# Patient Record
Sex: Female | Born: 1980 | Race: Asian | Hispanic: No | Marital: Married | Smoking: Never smoker
Health system: Southern US, Community
[De-identification: ages and names within clinical notes are randomized; demographics above are authoritative.]

## PROBLEM LIST (undated history)

## (undated) DIAGNOSIS — Z789 Other specified health status: Secondary | ICD-10-CM

## (undated) DIAGNOSIS — K219 Gastro-esophageal reflux disease without esophagitis: Secondary | ICD-10-CM

## (undated) HISTORY — DX: Gastro-esophageal reflux disease without esophagitis: K21.9

## (undated) HISTORY — PX: APPENDECTOMY: SHX54

## (undated) HISTORY — PX: UPPER GASTROINTESTINAL ENDOSCOPY: SHX188

---

## 2019-01-27 ENCOUNTER — Inpatient Hospital Stay (HOSPITAL_COMMUNITY)
Admission: AD | Admit: 2019-01-27 | Discharge: 2019-01-27 | Disposition: A | Discharge status: Home / Self Care | Payer: 59 | Attending: Obstetrics & Gynecology | Admitting: Obstetrics & Gynecology

## 2019-01-27 ENCOUNTER — Other Ambulatory Visit: Payer: Self-pay

## 2019-01-27 ENCOUNTER — Encounter (HOSPITAL_COMMUNITY): Payer: Self-pay | Admitting: *Deleted

## 2019-01-27 ENCOUNTER — Other Ambulatory Visit: Payer: Self-pay | Admitting: Obstetrics & Gynecology

## 2019-01-27 DIAGNOSIS — O00102 Left tubal pregnancy without intrauterine pregnancy: Secondary | ICD-10-CM | POA: Insufficient documentation

## 2019-01-27 HISTORY — DX: Other specified health status: Z78.9

## 2019-01-27 LAB — COMPREHENSIVE METABOLIC PANEL
ALT: 16 U/L (ref 0–44)
AST: 20 U/L (ref 15–41)
Albumin: 3.8 g/dL (ref 3.5–5.0)
Alkaline Phosphatase: 46 U/L (ref 38–126)
Anion gap: 8 (ref 5–15)
BUN: 8 mg/dL (ref 6–20)
CO2: 24 mmol/L (ref 22–32)
Calcium: 9.4 mg/dL (ref 8.9–10.3)
Chloride: 105 mmol/L (ref 98–111)
Creatinine, Ser: 0.63 mg/dL (ref 0.44–1.00)
GFR calc Af Amer: >60 mL/min (ref >60)
GFR calc non Af Amer: >60 mL/min (ref >60)
Glucose, Bld: 101 mg/dL — ABNORMAL HIGH (ref 70–99)
Potassium: 3.9 mmol/L (ref 3.5–5.1)
Sodium: 137 mmol/L (ref 135–145)
Total Bilirubin: 0.6 mg/dL (ref 0.3–1.2)
Total Protein: 7.1 g/dL (ref 6.5–8.1)

## 2019-01-27 LAB — CBC WITH DIFFERENTIAL/PLATELET
Abs Immature Granulocytes: 0.04 10*3/uL (ref 0.00–0.07)
Basophils Absolute: 0 10*3/uL (ref 0.0–0.1)
Basophils Relative: 0 %
Eosinophils Absolute: 0 10*3/uL (ref 0.0–0.5)
Eosinophils Relative: 0 %
HCT: 36.7 % (ref 36.0–46.0)
Hemoglobin: 12.2 g/dL (ref 12.0–15.0)
Immature Granulocytes: 0 %
Lymphocytes Relative: 15 %
Lymphs Abs: 1.6 10*3/uL (ref 0.7–4.0)
MCH: 28.8 pg (ref 26.0–34.0)
MCHC: 33.2 g/dL (ref 30.0–36.0)
MCV: 86.8 fL (ref 80.0–100.0)
Monocytes Absolute: 0.5 10*3/uL (ref 0.1–1.0)
Monocytes Relative: 4 %
Neutro Abs: 8.9 10*3/uL — ABNORMAL HIGH (ref 1.7–7.7)
Neutrophils Relative %: 81 %
Platelets: 285 10*3/uL (ref 150–400)
RBC: 4.23 MIL/uL (ref 3.87–5.11)
RDW: 13.2 % (ref 11.5–15.5)
WBC: 11.1 10*3/uL — ABNORMAL HIGH (ref 4.0–10.5)
nRBC: 0 % (ref 0.0–0.2)

## 2019-01-27 LAB — HCG, QUANTITATIVE, PREGNANCY: hCG, Beta Chain, Quant, S: 4471 m[IU]/mL — ABNORMAL HIGH (ref <5)

## 2019-01-27 MED ORDER — METHOTREXATE FOR ECTOPIC PREGNANCY
50.0000 mg/m2 | Freq: Once | INTRAMUSCULAR | Status: AC
  Administered 2019-01-27: 80 mg via INTRAMUSCULAR
  Filled 2019-01-27: qty 1

## 2019-01-27 NOTE — MAU Note (Signed)
Pt taking prenatal vitamins and progesterone, instructed pt to stop both.

## 2019-01-27 NOTE — MAU Note (Signed)
Pregnancy has been followed in the office, inappropriate rise in hormone level, left ectopic dx on [place] today.  Pt reports some spotting 6 days ago, denies pain.

## 2019-01-27 NOTE — MAU Note (Signed)
Diagnosed with left ectopic on [place] today, sent from office for Methotrexate.

## 2019-01-27 NOTE — Discharge Instructions (Signed)
Methotrexate Treatment for an Ectopic Pregnancy, Care After °This sheet gives you information about how to care for yourself after your procedure. Your health care provider may also give you more specific instructions. If you have problems or questions, contact your health care provider. °What can I expect after the procedure? °After the procedure, it is common to have: °· Abdominal cramping. °· Vaginal bleeding. °· Fatigue. °· Nausea. °· Vomiting. °· Diarrhea. °Blood tests will be taken at timed intervals for several days or weeks to check your pregnancy hormone levels. The blood tests will be done until the pregnancy hormone can no longer be detected in the blood. °Follow these instructions at home: °Activity °· Do not have sex until your health care provider approves. °· Limit activities that take a lot of effort as told by your health care provider. °Medicines °· Take over the counter and prescription medicines only as told by your health care provider. °· Do not take aspirin, ibuprofen, naproxen, or any other NSAIDs. °· Do not take folic acid, prenatal vitamins, or other vitamins that contain folic acid. °General instructions ° °· Do not drink alcohol. °· Follow instructions from your health care provider on how and when to report any symptoms that may indicate a ruptured ectopic pregnancy. °· Keep all follow-up visits as told by your health care provider. This is important. °Contact a health care provider if: °· You have persistent nausea and vomiting. °· You have persistent diarrhea. °· You are having a reaction to the medicine, such as: °? Tiredness. °? Skin rash. °? Hair loss. °Get help right away if: °· Your abdominal or pelvic pain gets worse. °· You have more vaginal bleeding. °· You feel light-headed or you faint. °· You have shortness of breath. °· Your heart rate increases. °· You develop a cough. °· You have chills. °· You have a fever. °Summary °· After the procedure, it is common to have symptoms  of abdominal cramping, vaginal bleeding and fatigue. You may also experience other symptoms. °· Blood tests will be taken at timed intervals for several days or weeks to check your pregnancy hormone levels. The blood tests will be done until the pregnancy hormone can no longer be detected in the blood. °· Limit strenuous activity as told by your health care provider. °· Follow instructions from your health care provider on how and when to report any symptoms that may indicate a ruptured ectopic pregnancy. °This information is not intended to replace advice given to you by your health care provider. Make sure you discuss any questions you have with your health care provider. °Document Released: 07/23/2011 Document Revised: 09/22/2016 Document Reviewed: 09/22/2016 °Elsevier Interactive Patient Education © 2019 [employer] ° °

## 2019-01-28 LAB — ABO/RH: ABO/RH(D): O POS

## 2019-02-02 ENCOUNTER — Inpatient Hospital Stay (HOSPITAL_COMMUNITY)
Admission: AD | Admit: 2019-02-02 | Discharge: 2019-02-02 | Disposition: A | Discharge status: Home / Self Care | Payer: 59 | Attending: Obstetrics | Admitting: Obstetrics

## 2019-02-02 ENCOUNTER — Other Ambulatory Visit: Payer: Self-pay

## 2019-02-02 ENCOUNTER — Other Ambulatory Visit: Payer: Self-pay | Admitting: Obstetrics & Gynecology

## 2019-02-02 DIAGNOSIS — Z3A00 Weeks of gestation of pregnancy not specified: Secondary | ICD-10-CM | POA: Diagnosis not present

## 2019-02-02 DIAGNOSIS — O00102 Left tubal pregnancy without intrauterine pregnancy: Secondary | ICD-10-CM | POA: Insufficient documentation

## 2019-02-02 DIAGNOSIS — O0000 Abdominal pregnancy without intrauterine pregnancy: Secondary | ICD-10-CM

## 2019-02-02 DIAGNOSIS — O009 Unspecified ectopic pregnancy without intrauterine pregnancy: Secondary | ICD-10-CM | POA: Insufficient documentation

## 2019-02-02 LAB — CBC WITH DIFFERENTIAL/PLATELET
Abs Immature Granulocytes: 0.01 10*3/uL (ref 0.00–0.07)
Basophils Absolute: 0 10*3/uL (ref 0.0–0.1)
Basophils Relative: 0 %
Eosinophils Absolute: 0 10*3/uL (ref 0.0–0.5)
Eosinophils Relative: 0 %
HCT: 35 % — ABNORMAL LOW (ref 36.0–46.0)
Hemoglobin: 11.7 g/dL — ABNORMAL LOW (ref 12.0–15.0)
Immature Granulocytes: 0 %
Lymphocytes Relative: 25 %
Lymphs Abs: 1.7 10*3/uL (ref 0.7–4.0)
MCH: 29 pg (ref 26.0–34.0)
MCHC: 33.4 g/dL (ref 30.0–36.0)
MCV: 86.6 fL (ref 80.0–100.0)
Monocytes Absolute: 0.5 10*3/uL (ref 0.1–1.0)
Monocytes Relative: 7 %
Neutro Abs: 4.6 10*3/uL (ref 1.7–7.7)
Neutrophils Relative %: 68 %
Platelets: 289 10*3/uL (ref 150–400)
RBC: 4.04 MIL/uL (ref 3.87–5.11)
RDW: 13 % (ref 11.5–15.5)
WBC: 6.9 10*3/uL (ref 4.0–10.5)
nRBC: 0 % (ref 0.0–0.2)

## 2019-02-02 LAB — COMPREHENSIVE METABOLIC PANEL
ALT: 14 U/L (ref 0–44)
AST: 15 U/L (ref 15–41)
Albumin: 3.9 g/dL (ref 3.5–5.0)
Alkaline Phosphatase: 41 U/L (ref 38–126)
Anion gap: 7 (ref 5–15)
BUN: <5 mg/dL — ABNORMAL LOW (ref 6–20)
CO2: 24 mmol/L (ref 22–32)
Calcium: 9.5 mg/dL (ref 8.9–10.3)
Chloride: 106 mmol/L (ref 98–111)
Creatinine, Ser: 0.63 mg/dL (ref 0.44–1.00)
GFR calc Af Amer: >60 mL/min (ref >60)
GFR calc non Af Amer: >60 mL/min (ref >60)
Glucose, Bld: 93 mg/dL (ref 70–99)
Potassium: 4 mmol/L (ref 3.5–5.1)
Sodium: 137 mmol/L (ref 135–145)
Total Bilirubin: 0.5 mg/dL (ref 0.3–1.2)
Total Protein: 6.9 g/dL (ref 6.5–8.1)

## 2019-02-02 MED ORDER — METHOTREXATE FOR ECTOPIC PREGNANCY
50.0000 mg/m2 | Freq: Once | INTRAMUSCULAR | Status: AC
  Administered 2019-02-02: 80 mg via INTRAMUSCULAR
  Filled 2019-02-02: qty 1

## 2019-02-02 NOTE — MAU Note (Signed)
Pt seen in office for D7 quant after Methotrexate.  D4 3300  D7 3400  Sono- adnexal mass persists, 2.9 cm (slightly larger), no FP or FCA, no free fluid. Pt denies pain.   Reviewed options, surgery vs repeat Methotrexate and close observation Pt not sure of what she wants, spoke with husband in office as well. Surgery and recovery reviewed, prior L/scopy for app'y at 19. Also reviewed stable enough to repeat MTX. Agreed to proceed with methotrexate and check quant in office on 6/22 AM and plan surgery if not appropriate or symptomatic Warning s/s reviewed.   V.Sidney Kann, MD

## 2019-02-02 NOTE — MAU Note (Signed)
Pt here for second dose of MTX. Denies any pain. Scant spotting. Had vag u/s and BHCG in office today. Dr Benjie Karvonen spoke with pt tonight about poc and pt decided for second dose of MTX. Will follow up Monday am in office. Pt's labs drawn in Triage. Understands will wait in lobby for lab results, will have MTX, wait 76mins and then d/c home if no concerns arise.

## 2019-02-02 NOTE — MAU Note (Signed)
Dr Benjie Karvonen notified of pt's lab results.  OK to give MTX

## 2019-02-11 ENCOUNTER — Ambulatory Visit (HOSPITAL_COMMUNITY)
Admission: AD | Admit: 2019-02-11 | Discharge: 2019-02-11 | Disposition: A | Discharge status: Home / Self Care | Payer: 59 | Attending: Obstetrics and Gynecology | Admitting: Obstetrics and Gynecology

## 2019-02-11 ENCOUNTER — Inpatient Hospital Stay (HOSPITAL_COMMUNITY): Payer: 59

## 2019-02-11 ENCOUNTER — Other Ambulatory Visit: Payer: Self-pay

## 2019-02-11 ENCOUNTER — Encounter (HOSPITAL_COMMUNITY)
Admission: AD | Disposition: A | Discharge status: Home / Self Care | Payer: Self-pay | Source: Home / Self Care | Attending: Obstetrics and Gynecology

## 2019-02-11 ENCOUNTER — Inpatient Hospital Stay (HOSPITAL_COMMUNITY): Payer: 59 | Admitting: Anesthesiology

## 2019-02-11 ENCOUNTER — Encounter (HOSPITAL_COMMUNITY): Payer: Self-pay

## 2019-02-11 DIAGNOSIS — O009 Unspecified ectopic pregnancy without intrauterine pregnancy: Secondary | ICD-10-CM | POA: Diagnosis present

## 2019-02-11 DIAGNOSIS — Z1159 Encounter for screening for other viral diseases: Secondary | ICD-10-CM | POA: Insufficient documentation

## 2019-02-11 DIAGNOSIS — O4690 Antepartum hemorrhage, unspecified, unspecified trimester: Secondary | ICD-10-CM

## 2019-02-11 DIAGNOSIS — O00102 Left tubal pregnancy without intrauterine pregnancy: Secondary | ICD-10-CM | POA: Insufficient documentation

## 2019-02-11 DIAGNOSIS — O00202 Left ovarian pregnancy without intrauterine pregnancy: Secondary | ICD-10-CM

## 2019-02-11 DIAGNOSIS — Z3A08 8 weeks gestation of pregnancy: Secondary | ICD-10-CM | POA: Insufficient documentation

## 2019-02-11 DIAGNOSIS — R42 Dizziness and giddiness: Secondary | ICD-10-CM

## 2019-02-11 DIAGNOSIS — O26899 Other specified pregnancy related conditions, unspecified trimester: Secondary | ICD-10-CM

## 2019-02-11 HISTORY — PX: DIAGNOSTIC LAPAROSCOPY WITH REMOVAL OF ECTOPIC PREGNANCY: SHX6449

## 2019-02-11 LAB — CBC
HCT: 32.4 % — ABNORMAL LOW (ref 36.0–46.0)
Hemoglobin: 10.7 g/dL — ABNORMAL LOW (ref 12.0–15.0)
MCH: 29.3 pg (ref 26.0–34.0)
MCHC: 33 g/dL (ref 30.0–36.0)
MCV: 88.8 fL (ref 80.0–100.0)
Platelets: 276 10*3/uL (ref 150–400)
RBC: 3.65 MIL/uL — ABNORMAL LOW (ref 3.87–5.11)
RDW: 13.7 % (ref 11.5–15.5)
WBC: 9.5 10*3/uL (ref 4.0–10.5)
nRBC: 0 % (ref 0.0–0.2)

## 2019-02-11 LAB — COMPREHENSIVE METABOLIC PANEL
ALT: 16 U/L (ref 0–44)
AST: 18 U/L (ref 15–41)
Albumin: 3.7 g/dL (ref 3.5–5.0)
Alkaline Phosphatase: 40 U/L (ref 38–126)
Anion gap: 9 (ref 5–15)
BUN: 7 mg/dL (ref 6–20)
CO2: 24 mmol/L (ref 22–32)
Calcium: 9.1 mg/dL (ref 8.9–10.3)
Chloride: 105 mmol/L (ref 98–111)
Creatinine, Ser: 0.65 mg/dL (ref 0.44–1.00)
GFR calc Af Amer: >60 mL/min (ref >60)
GFR calc non Af Amer: >60 mL/min (ref >60)
Glucose, Bld: 101 mg/dL — ABNORMAL HIGH (ref 70–99)
Potassium: 3.8 mmol/L (ref 3.5–5.1)
Sodium: 138 mmol/L (ref 135–145)
Total Bilirubin: 0.4 mg/dL (ref 0.3–1.2)
Total Protein: 6.7 g/dL (ref 6.5–8.1)

## 2019-02-11 LAB — TYPE AND SCREEN
ABO/RH(D): O POS
Antibody Screen: NEGATIVE

## 2019-02-11 LAB — SARS CORONAVIRUS 2 BY RT PCR (HOSPITAL ORDER, PERFORMED IN ~~LOC~~ HOSPITAL LAB): SARS Coronavirus 2: NEGATIVE

## 2019-02-11 LAB — HCG, QUANTITATIVE, PREGNANCY: hCG, Beta Chain, Quant, S: 1808 m[IU]/mL — ABNORMAL HIGH (ref <5)

## 2019-02-11 SURGERY — LAPAROSCOPY, WITH ECTOPIC PREGNANCY SURGICAL TREATMENT
Anesthesia: General

## 2019-02-11 MED ORDER — BUPIVACAINE HCL (PF) 0.25 % IJ SOLN
INTRAMUSCULAR | Status: AC
  Filled 2019-02-11: qty 30

## 2019-02-11 MED ORDER — BUPIVACAINE HCL (PF) 0.25 % IJ SOLN
INTRAMUSCULAR | Status: DC | PRN
  Administered 2019-02-11: 8 mL

## 2019-02-11 MED ORDER — CEFAZOLIN SODIUM 1 G IJ SOLR
INTRAMUSCULAR | Status: AC
  Filled 2019-02-11: qty 20

## 2019-02-11 MED ORDER — IBUPROFEN 800 MG PO TABS: 800.0000 mg | ORAL_TABLET | Freq: Three times a day (TID) | ORAL | 0 refills | Status: DC | PRN

## 2019-02-11 MED ORDER — MIDAZOLAM HCL 5 MG/5ML IJ SOLN
INTRAMUSCULAR | Status: DC | PRN
  Administered 2019-02-11: 2 mg via INTRAVENOUS

## 2019-02-11 MED ORDER — CEFAZOLIN SODIUM-DEXTROSE 2-3 GM-%(50ML) IV SOLR
INTRAVENOUS | Status: DC | PRN
  Administered 2019-02-11: 2 g via INTRAVENOUS

## 2019-02-11 MED ORDER — OXYCODONE HCL 5 MG/5ML PO SOLN: 5.0000 mg | Freq: Once | ORAL | Status: AC | PRN

## 2019-02-11 MED ORDER — SODIUM CHLORIDE 0.9 % IR SOLN
Status: DC | PRN
  Administered 2019-02-11: 1000 mL

## 2019-02-11 MED ORDER — HYDROCODONE-ACETAMINOPHEN 5-325 MG PO TABS: 1.0000 | ORAL_TABLET | Freq: Four times a day (QID) | ORAL | 0 refills | Status: DC | PRN

## 2019-02-11 MED ORDER — FENTANYL CITRATE (PF) 100 MCG/2ML IJ SOLN
INTRAMUSCULAR | Status: DC | PRN
  Administered 2019-02-11 (×2): 50 ug via INTRAVENOUS
  Administered 2019-02-11: 100 ug via INTRAVENOUS
  Administered 2019-02-11: 50 ug via INTRAVENOUS

## 2019-02-11 MED ORDER — KETOROLAC TROMETHAMINE 30 MG/ML IJ SOLN
INTRAMUSCULAR | Status: AC
  Filled 2019-02-11: qty 1

## 2019-02-11 MED ORDER — LIDOCAINE 2% (20 MG/ML) 5 ML SYRINGE
INTRAMUSCULAR | Status: AC
  Filled 2019-02-11: qty 5

## 2019-02-11 MED ORDER — PROPOFOL 10 MG/ML IV BOLUS
INTRAVENOUS | Status: AC
  Filled 2019-02-11: qty 20

## 2019-02-11 MED ORDER — HYDROMORPHONE HCL 1 MG/ML IJ SOLN
INTRAMUSCULAR | Status: AC
  Filled 2019-02-11: qty 1

## 2019-02-11 MED ORDER — ROCURONIUM BROMIDE 50 MG/5ML IV SOSY
PREFILLED_SYRINGE | INTRAVENOUS | Status: DC | PRN
  Administered 2019-02-11: 20 mg via INTRAVENOUS
  Administered 2019-02-11: 40 mg via INTRAVENOUS

## 2019-02-11 MED ORDER — PROMETHAZINE HCL 25 MG/ML IJ SOLN: 6.2500 mg | INTRAMUSCULAR | Status: DC | PRN

## 2019-02-11 MED ORDER — ONDANSETRON HCL 4 MG/2ML IJ SOLN
INTRAMUSCULAR | Status: DC | PRN
  Administered 2019-02-11 (×2): 4 mg via INTRAVENOUS

## 2019-02-11 MED ORDER — HYDROMORPHONE HCL 1 MG/ML IJ SOLN
0.2500 mg | INTRAMUSCULAR | Status: DC | PRN
  Administered 2019-02-11 (×2): 0.25 mg via INTRAVENOUS

## 2019-02-11 MED ORDER — ACETAMINOPHEN 10 MG/ML IV SOLN
INTRAVENOUS | Status: AC
  Filled 2019-02-11: qty 100

## 2019-02-11 MED ORDER — DEXAMETHASONE SODIUM PHOSPHATE 10 MG/ML IJ SOLN
INTRAMUSCULAR | Status: DC | PRN
  Administered 2019-02-11: 10 mg via INTRAVENOUS

## 2019-02-11 MED ORDER — LACTATED RINGERS IV SOLN
INTRAVENOUS | Status: DC
  Administered 2019-02-11 (×2): via INTRAVENOUS

## 2019-02-11 MED ORDER — PHENYLEPHRINE HCL (PRESSORS) 10 MG/ML IV SOLN
INTRAVENOUS | Status: DC | PRN
  Administered 2019-02-11 (×5): 80 ug via INTRAVENOUS

## 2019-02-11 MED ORDER — PHENYLEPHRINE 40 MCG/ML (10ML) SYRINGE FOR IV PUSH (FOR BLOOD PRESSURE SUPPORT)
PREFILLED_SYRINGE | INTRAVENOUS | Status: AC
  Filled 2019-02-11: qty 10

## 2019-02-11 MED ORDER — ACETAMINOPHEN 10 MG/ML IV SOLN
1000.0000 mg | Freq: Once | INTRAVENOUS | Status: DC | PRN
  Administered 2019-02-11: 1000 mg via INTRAVENOUS

## 2019-02-11 MED ORDER — LIDOCAINE 2% (20 MG/ML) 5 ML SYRINGE
INTRAMUSCULAR | Status: DC | PRN
  Administered 2019-02-11: 60 mg via INTRAVENOUS

## 2019-02-11 MED ORDER — ROCURONIUM BROMIDE 10 MG/ML (PF) SYRINGE
PREFILLED_SYRINGE | INTRAVENOUS | Status: AC
  Filled 2019-02-11: qty 10

## 2019-02-11 MED ORDER — OXYCODONE HCL 5 MG PO TABS
5.0000 mg | ORAL_TABLET | Freq: Once | ORAL | Status: AC | PRN
  Administered 2019-02-11: 5 mg via ORAL

## 2019-02-11 MED ORDER — LACTATED RINGERS IV BOLUS
1000.0000 mL | Freq: Once | INTRAVENOUS | Status: AC
  Administered 2019-02-11: 1000 mL via INTRAVENOUS

## 2019-02-11 MED ORDER — PROPOFOL 10 MG/ML IV BOLUS
INTRAVENOUS | Status: DC | PRN
  Administered 2019-02-11: 150 mg via INTRAVENOUS

## 2019-02-11 MED ORDER — SCOPOLAMINE 1 MG/3DAYS TD PT72
MEDICATED_PATCH | TRANSDERMAL | Status: AC
  Administered 2019-02-11: 1.5 mg via TRANSDERMAL
  Filled 2019-02-11: qty 1

## 2019-02-11 MED ORDER — FENTANYL CITRATE (PF) 250 MCG/5ML IJ SOLN
INTRAMUSCULAR | Status: AC
  Filled 2019-02-11: qty 5

## 2019-02-11 MED ORDER — KETOROLAC TROMETHAMINE 30 MG/ML IJ SOLN: 30.0000 mg | Freq: Once | INTRAMUSCULAR | Status: DC

## 2019-02-11 MED ORDER — DEXAMETHASONE SODIUM PHOSPHATE 10 MG/ML IJ SOLN
INTRAMUSCULAR | Status: AC
  Filled 2019-02-11: qty 1

## 2019-02-11 MED ORDER — ONDANSETRON HCL 4 MG/2ML IJ SOLN
INTRAMUSCULAR | Status: AC
  Filled 2019-02-11: qty 2

## 2019-02-11 MED ORDER — OXYCODONE HCL 5 MG PO TABS
ORAL_TABLET | ORAL | Status: AC
  Filled 2019-02-11: qty 1

## 2019-02-11 MED ORDER — MIDAZOLAM HCL 2 MG/2ML IJ SOLN
INTRAMUSCULAR | Status: AC
  Filled 2019-02-11: qty 2

## 2019-02-11 MED ORDER — SCOPOLAMINE 1 MG/3DAYS TD PT72
1.0000 | MEDICATED_PATCH | TRANSDERMAL | Status: DC
  Administered 2019-02-11: 18:00:00 1.5 mg via TRANSDERMAL

## 2019-02-11 SURGICAL SUPPLY — 32 items
CONT SPEC 4OZ CLIKSEAL STRL BL (MISCELLANEOUS) ×3 IMPLANT
COVER WAND RF STERILE (DRAPES) ×3 IMPLANT
DERMABOND ADVANCED (GAUZE/BANDAGES/DRESSINGS) ×2
DERMABOND ADVANCED .7 DNX12 (GAUZE/BANDAGES/DRESSINGS) ×1 IMPLANT
DRSG OPSITE POSTOP 3X4 (GAUZE/BANDAGES/DRESSINGS) ×3 IMPLANT
GLOVE BIOGEL PI IND STRL 6.5 (GLOVE) ×2 IMPLANT
GLOVE BIOGEL PI IND STRL 7.0 (GLOVE) ×2 IMPLANT
GLOVE BIOGEL PI INDICATOR 6.5 (GLOVE) ×4
GLOVE BIOGEL PI INDICATOR 7.0 (GLOVE) ×4
GLOVE ECLIPSE 6.5 STRL STRAW (GLOVE) ×3 IMPLANT
GOWN STRL REUS W/ TWL LRG LVL3 (GOWN DISPOSABLE) ×4 IMPLANT
GOWN STRL REUS W/TWL LRG LVL3 (GOWN DISPOSABLE) ×8
KIT TURNOVER KIT B (KITS) ×3 IMPLANT
PACK LAPAROSCOPY BASIN (CUSTOM PROCEDURE TRAY) ×3 IMPLANT
PACK TRENDGUARD 450 HYBRID PRO (MISCELLANEOUS) ×1 IMPLANT
POUCH SPECIMEN RETRIEVAL 10MM (ENDOMECHANICALS) ×6 IMPLANT
PROTECTOR NERVE ULNAR (MISCELLANEOUS) ×3 IMPLANT
SET IRRIG TUBING LAPAROSCOPIC (IRRIGATION / IRRIGATOR) ×3 IMPLANT
SET TUBE SMOKE EVAC HIGH FLOW (TUBING) ×3 IMPLANT
SHEARS HARMONIC ACE PLUS 36CM (ENDOMECHANICALS) ×3 IMPLANT
SLEEVE ENDOPATH XCEL 5M (ENDOMECHANICALS) ×3 IMPLANT
SUT MNCRL AB 4-0 PS2 18 (SUTURE) ×3 IMPLANT
SUT VIC AB 4-0 PS2 27 (SUTURE) ×3 IMPLANT
SUT VICRYL 0 UR6 27IN ABS (SUTURE) ×3 IMPLANT
SUT VICRYL 4-0 PS2 18IN ABS (SUTURE) ×3 IMPLANT
SYR 5ML LL (SYRINGE) ×3 IMPLANT
TOWEL GREEN STERILE FF (TOWEL DISPOSABLE) ×6 IMPLANT
TRAY FOLEY CATH 14FR (SET/KITS/TRAYS/PACK) ×3 IMPLANT
TRENDGUARD 450 HYBRID PRO PACK (MISCELLANEOUS) ×3
TROCAR XCEL NON-BLD 11X100MML (ENDOMECHANICALS) ×3 IMPLANT
TROCAR XCEL NON-BLD 5MMX100MML (ENDOMECHANICALS) ×3 IMPLANT
WARMER LAPAROSCOPE (MISCELLANEOUS) ×3 IMPLANT

## 2019-02-11 NOTE — H&P (Signed)
Casey Pearson is an 38 y.o. female, G2P1001 with known ectopic pregnancy presents to ED with c/o dizziness today.  Not passing out, just had a mild episode of mild dizziness and was given precautions to go to ED if had sx of dizziness.  Patient reports that she did have some pain last night, took tylenol and did help with pain, but seems to have only given pain relief for about 1 hr.  Pain resolved on its own, denies any pain today.  She denies any vaginal bleeding.  She has only had milk and a cookie at 1100 this am. The patient has been treated with MTX x2 doses, with last dose 02/02/19.  BHCG 3400 6/18, repeated 6/22 (2700) and then bhcg 1700 6/25.  The patient has also been followed for a complex left adnexal mass that was adjacent to left ovary, last measured 6/18 and about 2.9x2.5cm, slightly larger than prior u/s.   Pertinent Gynecological History:   OB History: h/o c/s, term  Menstrual History:  Patient's last menstrual period was 12/14/2018.    Past Medical History:  Diagnosis Date  . Medical history non-contributory     Past Surgical History:  Procedure Laterality Date  . APPENDECTOMY    . CESAREAN SECTION      Family History  Problem Relation Age of Onset  . Diabetes Mother   . Hypertension Father     Social History:  reports that she has never smoked. She has never used smokeless tobacco. She reports that she does not drink alcohol or use drugs.  Allergies: No Known Allergies  No medications prior to admission.    ROS SOB/chest pain/ HA/ vision changes/ LE pain or swelling/ etc.  Blood pressure (!) 114/51, pulse 76, temperature 97.8 F (36.6 C), temperature source Oral, resp. rate 20, height 4\' 11"  (1.499 m), weight 58 kg, last menstrual period 12/14/2018, SpO2 100 %. Physical Exam A&O x 3 HEENT : grossly wnl Lungs : ctab CV : rrr Abdo : soft, nt, no rebound/guarding Extr : no edema, nt Pelvic : deferred   Results for orders placed or performed  during the hospital encounter of 02/11/19 (from the past 24 hour(s))  CBC     Status: Abnormal   Collection Time: 02/11/19  3:08 PM  Result Value Ref Range   WBC 9.5 4.0 - 10.5 K/uL   RBC 3.65 (L) 3.87 - 5.11 MIL/uL   Hemoglobin 10.7 (L) 12.0 - 15.0 g/dL   HCT 16.132.4 (L) 09.636.0 - 04.546.0 %   MCV 88.8 80.0 - 100.0 fL   MCH 29.3 26.0 - 34.0 pg   MCHC 33.0 30.0 - 36.0 g/dL   RDW 40.913.7 81.111.5 - 91.415.5 %   Platelets 276 150 - 400 K/uL   nRBC 0.0 0.0 - 0.2 %  Comprehensive metabolic panel     Status: Abnormal   Collection Time: 02/11/19  3:08 PM  Result Value Ref Range   Sodium 138 135 - 145 mmol/L   Potassium 3.8 3.5 - 5.1 mmol/L   Chloride 105 98 - 111 mmol/L   CO2 24 22 - 32 mmol/L   Glucose, Bld 101 (H) 70 - 99 mg/dL   BUN 7 6 - 20 mg/dL   Creatinine, Ser 7.820.65 0.44 - 1.00 mg/dL   Calcium 9.1 8.9 - 95.610.3 mg/dL   Total Protein 6.7 6.5 - 8.1 g/dL   Albumin 3.7 3.5 - 5.0 g/dL   AST 18 15 - 41 U/L   ALT 16 0 - 44 U/L  Alkaline Phosphatase 40 38 - 126 U/L   Total Bilirubin 0.4 0.3 - 1.2 mg/dL   GFR calc non Af Amer >60 >60 mL/min   GFR calc Af Amer >60 >60 mL/min   Anion gap 9 5 - 15  hCG, quantitative, pregnancy     Status: Abnormal   Collection Time: 02/11/19  3:08 PM  Result Value Ref Range   hCG, Beta Chain, Quant, S 1,808 (H) <5 mIU/mL  SARS Coronavirus 2 (CEPHEID - Performed in [facility] hospital lab), Hosp Order     Status: None   Collection Time: 02/11/19  3:32 PM   Specimen: Nasopharyngeal Swab  Result Value Ref Range   SARS Coronavirus 2 NEGATIVE NEGATIVE    US Ob Less Than 14 Weeks With Ob Transvaginal  Result Date: 02/11/2019 CLINICAL DATA:  38 year old female reportedly 8 weeks 3 days pregnant with a history of known left ectopic pregnancy. She has undergone 2 methotrexate injections and has had pain for the past 3 days. EXAM: OBSTETRIC <14 WK [place] AND TRANSVAGINAL OB US TECHNIQUE: Both transabdominal and transvaginal ultrasound examinations were performed for complete  evaluation of the gestation as well as the maternal uterus, adnexal regions, and pelvic cul-de-sac. Transvaginal technique was performed to assess early pregnancy. COMPARISON:  None. FINDINGS: Intrauterine gestational sac: None Yolk sac:  Not Visualized. Embryo:  Not Visualized. Cardiac Activity: Not Visualized. Maternal uterus/adnexae: The uterus is normal in appearance. The right ovary is also normal in appearance. A heterogeneous soft tissue mass is present in the left adnexal region measuring approximately 6.5 x 3.7 x 5.6 cm. Normal ovary is inseparable from the structure. There is no evidence of internal vascularity. Small volume free fluid layers in the pelvis. IMPRESSION: 1. Approximately 6.5 x 3.7 x 5.6 cm heterogeneous mass in the region of the left adnexa inseparable from the ovary. No evidence of internal vascularity. Findings may represent focal hematoma related to the known ectopic pregnancy versus a potentially torsed left ovary. 2. Small volume of free simple fluid layers in the anatomic pelvis. 3. The uterus and right ovary are unremarkable in appearance. Electronically Signed   By: Jacqulynn Cadet M.D.   On: 02/11/2019 14:53    Assessment/Plan: 38 y/o G2P1001 with ectopic pregnancy 1. I have reviewed the enlarging mass and concern for rupturing of ectopic.  Minimal blood in pelvis but mass has enlarged despite MTX x2 with last dose 6/18.  At this time, I recommemnd  We proceed for l/s salpingectomy and removal of ectopic pregnancy.  Patient is consented for possibel oopohorectomy if necessary and patient agrees.  Failed medical managmetn of ectopic pregnacy.  Reviewed l/s and risk including but not limited to bleedin, infection, injury to bowel, bladder, nerves, blood vessels, risk of further surgery, risk of anesthesia, risk of blood clot to lungs or legs.  Patient sidned consent.  Questions answered.  Proceed to or.  Charyl Bigger 02/11/2019, 5:11 PM

## 2019-02-11 NOTE — MAU Note (Signed)
Pt ok to be transported by transportation staff.

## 2019-02-11 NOTE — Anesthesia Postprocedure Evaluation (Signed)
Anesthesia Post Note  Patient: Therapist, nutritional  Procedure(s) Performed: Laparoscopic LEFT Salpingo Oophorectomy AND REMOVAL OF ECTOPIC PREGNANCY (N/A )     Patient location during evaluation: PACU Anesthesia Type: General Level of consciousness: awake and alert Pain management: pain level controlled Vital Signs Assessment: post-procedure vital signs reviewed and stable Respiratory status: spontaneous breathing, nonlabored ventilation, respiratory function stable and patient connected to nasal cannula oxygen Cardiovascular status: blood pressure returned to baseline and stable Postop Assessment: no apparent nausea or vomiting Anesthetic complications: no    Last Vitals:  Vitals:   02/11/19 2108 02/11/19 2130  BP: (!) 107/59 (!) 103/53  Pulse: 92 92  Resp: 11 15  Temp:  (!) 36.1 C  SpO2: 100% 100%    Last Pain:  Vitals:   02/11/19 2045  TempSrc:   PainSc: 8                  Ryan P Ellender

## 2019-02-11 NOTE — MAU Note (Signed)
Casey Pearson is a 38 y.o. here in MAU reporting: has a left ectopic pregnancy, has had 2 MTX injections. States for the past 3 days her pain has gotten worse. Tylenol was working for the pain but now it is not anymore. States she has been having some bleeding, is changing a pad twice a day.   Onset of complaint: ongoing  Pain score: 5/10  Vitals:   02/11/19 1313  BP: (!) 94/28  Pulse: 68  Resp: 20  Temp: 97.8 F (36.6 C)  SpO2: 100%      Lab orders placed from triage: none

## 2019-02-11 NOTE — Op Note (Signed)
Preoperative diagnosis: ectopic pregnancy, enlarging adnexal mass, failed medical managment Postoperative diagnosis: left ectopic pregnancy with mass including tube and ovary; mild adhesive disease  Procedure: Laparoscopic left salpingo-oophorectomy Surgeon: Dr Tiana Loft, MD Assistants: Artelia Laroche, CNM Anesthesia Gen. Endotracheal IV fluids 1liter LR EBL : 105ml Urine clear: 35ml clear urine at surgery end Complications none Disposition PACU and home Specimens : left tube and ovary  Procedure The patient was seen in MAU and concern for early rupture of ectopic pregnancy.  Known ectopic pregnancy with left adnexal mass, now with enlarging mass.  Recommendation to proceed for laparoscopic salpingectomy, possible oophorectomy. Patient voiced understanding. Informed written consent was obtained and patient was brought to the operating room with IV running. Timeout was carried out. She underwent general anesthesia without difficulty and was given dorsal lithotomy position with left arm tucked. Patient prepped and draped in standard fashion. Foley catheter was placed with clear urine draining. Speculum was placed anterior lip of cervix was grasped with tenaculum, a hulka manipulator was then placed.  Gloves were changed attention was focused on the abdomen. A 10 mm vertical incision was made at the umbilicus after injecting 10 cc Marcaine. The trocar was then introduced into the abdomen under direct visualization.  Opening pressure was 67mmHg and Insufflation was begun with CO2. Patient was given Trendelenburg position. A 0 laparoscope was introduced.  Attention was then drawn to the right lower quadrant and injection of .25%marcaine was injected.  Skin incision was then made to allow placement for 63mm trocar.  Trocar was then advanced w/o difficulty.  A llq trocar was placed in a similar fashion. Adhesions were noted of anterior aspect of uterus to abdominal wall.   Adhesion also noted from omentum  to anterior abdominal wall - this was closely examined after trocars placed and was not disturbed with trocar placement.  There was a small amount of blood in pelvis, no active bleeding.   Attention was drawn to the left adnexa which was grasped and a large mass was noted in the fallopian tube, however the ovary was adherent to the tube and large clots from the tube also encapsulating the ovary.  Right tube and ovary were noted to be normal appearing, and uterus appeared wnl.  Suction was performed of the large mass to visualize the location of the ectopic.  The mass appeared to be in the tube with part of tube near fimbria possible ruptured.  The tube/mass was grasped and the harmonic scalpel was then used to remove the fallopian tube.  This was done in portions with first removing the distal end with the mass to allow better visualization of the ovary and remaining tube.  The ovary was cleared of the clots that were encapsulating it.  The ovary was not easy to separate from the tube and did not appear within normal limits.  Decision was made to remove the ovary as it seemed that the ectopic could have been involving the ovary.  The harmonic scalpel was then used to cauterize and transect the infundibulopelvic ligament and the ovary and remaining tube was removed.  The pedical was hemostatic.  The pelvis was suctioned and cleared of blood.  A specimen bag was introduced and the tube and ovary/mass were placed into the endopouch.  Two bags were used as the first bag did not open correctly and only part of the specimen was able to be placed into the bag.  The trocar was removed and hemostat was used to remove the specimen  until the bag could advance through the fascial opening.  The trocar was then placed back into the abdomen and the second endopouch was then used.  The remaining specimen was placed into this bag and the bag was removed w/o difficulty.  The pelvis was then examined and then irrigated.   The upper  abdomen was examined and appeared wnl.    All counts were correct x2 patient was reversed from general anesthesia extubated and brought out to the recovery room in stable condition. Plan is to discharge home from recovery room. Surgical findings were discussed with patient's family. Followup in office in 2 weeks.

## 2019-02-11 NOTE — Anesthesia Preprocedure Evaluation (Addendum)
Anesthesia Evaluation  Patient identified by MRN, date of birth, ID band Patient awake    Reviewed: Allergy & Precautions, NPO status , Patient's Chart, lab work & pertinent test results  Airway Mallampati: II  TM Distance: >3 FB Neck ROM: Full    Dental no notable dental hx.    Pulmonary neg pulmonary ROS,    Pulmonary exam normal breath sounds clear to auscultation       Cardiovascular negative cardio ROS Normal cardiovascular exam Rhythm:Regular Rate:Normal     Neuro/Psych negative neurological ROS  negative psych ROS   GI/Hepatic negative GI ROS, Neg liver ROS,   Endo/Other  negative endocrine ROS  Renal/GU negative Renal ROS     Musculoskeletal negative musculoskeletal ROS (+)   Abdominal   Peds  Hematology  (+) anemia ,   Anesthesia Other Findings left ectopic pregnancy  Reproductive/Obstetrics                            Anesthesia Physical Anesthesia Plan  ASA: II and emergent  Anesthesia Plan: General   Post-op Pain Management:    Induction: Intravenous and Rapid sequence  PONV Risk Score and Plan: 4 or greater and Ondansetron, Dexamethasone, Midazolam, Scopolamine patch - Pre-op and Treatment may vary due to age or medical condition  Airway Management Planned: Oral ETT  Additional Equipment:   Intra-op Plan:   Post-operative Plan: Extubation in OR  Informed Consent: I have reviewed the patients History and Physical, chart, labs and discussed the procedure including the risks, benefits and alternatives for the proposed anesthesia with the patient or authorized representative who has indicated his/her understanding and acceptance.     Dental advisory given  Plan Discussed with: CRNA  Anesthesia Plan Comments:        Anesthesia Quick Evaluation

## 2019-02-11 NOTE — Transfer of Care (Signed)
Immediate Anesthesia Transfer of Care Note  Patient: Casey Pearson  Procedure(s) Performed: Laparoscopic LEFT Salpingo Oophorectomy AND REMOVAL OF ECTOPIC PREGNANCY (N/A )  Patient Location: PACU  Anesthesia Type:General  Level of Consciousness: awake, alert , oriented and patient cooperative  Airway & Oxygen Therapy: Patient Spontanous Breathing and Patient connected to nasal cannula oxygen  Post-op Assessment: Report given to RN and Post -op Vital signs reviewed and stable  Post vital signs: Reviewed and stable  Last Vitals:  Vitals Value Taken Time  BP 111/48 02/11/19 1956  Temp 36 C 02/11/19 1956  Pulse 95 02/11/19 1957  Resp 17 02/11/19 1957  SpO2 100 % 02/11/19 1957  Vitals shown include unvalidated device data.  Last Pain:  Vitals:   02/11/19 1313  TempSrc: Oral  PainSc:          Complications: No apparent anesthesia complications

## 2019-02-11 NOTE — MAU Provider Note (Signed)
History     CSN: [id number]  Arrival date and time: 02/11/19 1258   First Provider Initiated Contact with Patient 02/11/19 1336      Chief Complaint  Patient presents with  . Back Pain   Ms. Casey Pearson is a 38 y.o. G2P1001 with left ectopic who presents to MAU for pelvic pain x3days and dizziness since this AM. Pt reports she did not report the pain to her OB because she was counseled that she would have pain and bleeding after her MTX injection. Pt reports she came in today because she started feeling dizzy for the first time and was told by a nurse at her office that if she was having dizziness along with her pain that she should present to MAU to be seen.  Pt was treated with MTX x2 for a left ectopic pregnancy, with most recent treatment on 02/02/2019. Pt reports she was seen on Day 4 in office for hCG (2700), and again at the end of this week for Day 7 hCG (1700). Pt reports she was told by her provider that her levels were dropping appropriately, and she is planning to be seen again on Thursday 02/16/2019 for a repeat hCG.  Pt reports her pelvic pain is bilateral, rates as 5/10 and is not visibly uncomfortable and is able to carry on a conversation normally. Pt reports her pain began about 3-4days ago. Pt reports last night she experienced a sharp pain in her right thigh. Pt states her pain usually occurs only at night for about 2-3hrs starting @2000 , but today reports she experienced pelvic pain during the day as well.  Pt reports VB.  Pt denies vaginal discharge/odor/itching. Pt denies N/V, abdominal pain, constipation, diarrhea, or urinary problems. Pt denies fever, chills, fatigue, sweating or changes in appetite. Pt denies SOB or chest pain. Pt denies HA, light-headedness, weakness.   OB History    Gravida  2   Para  1   Term  1   Preterm      AB      Living  1     SAB      TAB      Ectopic      Multiple      Live Births  1        Obstetric  Comments  Failed to dilate, del in [place]        Past Medical History:  Diagnosis Date  . Medical history non-contributory     Past Surgical History:  Procedure Laterality Date  . APPENDECTOMY    . CESAREAN SECTION      Family History  Problem Relation Age of Onset  . Diabetes Mother   . Hypertension Father     Social History   Tobacco Use  . Smoking status: Never Smoker  . Smokeless tobacco: Never Used  Substance Use Topics  . Alcohol use: Never    Frequency: Never  . Drug use: Never    Allergies: No Known Allergies  No medications prior to admission.    Review of Systems  Constitutional: Negative for chills, diaphoresis, fatigue and fever.  Respiratory: Negative for shortness of breath.   Cardiovascular: Negative for chest pain.  Gastrointestinal: Negative for abdominal pain, constipation, diarrhea, nausea and vomiting.  Genitourinary: Positive for pelvic pain and vaginal bleeding. Negative for dysuria, flank pain, frequency, urgency and vaginal discharge.  Neurological: Positive for dizziness. Negative for weakness, light-headedness and headaches.   Physical Exam   Blood pressure (!) 114/51, pulse  76, temperature 97.8 F (36.6 C), temperature source Oral, resp. rate 20, height 4\' 11"  (1.499 m), weight 58 kg, last menstrual period 12/14/2018, SpO2 100 %.  Patient Vitals for the past 24 hrs:  BP Temp Temp src Pulse Resp SpO2 Height Weight  02/11/19 1538 (!) 114/51 - - 76 - - - -  02/11/19 1322 (!) 104/52 - - 69 - - - -  02/11/19 1321 (!) 104/52 - - - 20 - - -  02/11/19 1313 (!) 94/28 97.8 F (36.6 C) Oral 68 20 100 % - -  02/11/19 1306 - - - - - - 4\' 11"  (1.499 m) 58 kg   Physical Exam  Constitutional: She is oriented to person, place, and time. She appears well-developed and well-nourished. No distress.  HENT:  Head: Normocephalic and atraumatic.  Respiratory: Effort normal.  GI: Soft. She exhibits no distension and no mass. There is no abdominal  tenderness. There is no rebound and no guarding.  Neurological: She is alert and oriented to person, place, and time.  Skin: Skin is warm and dry. She is not diaphoretic.  Psychiatric: She has a normal mood and affect. Her behavior is normal. Judgment and thought content normal.   Results for orders placed or performed during the hospital encounter of 02/11/19 (from the past 24 hour(s))  CBC     Status: Abnormal   Collection Time: 02/11/19  3:08 PM  Result Value Ref Range   WBC 9.5 4.0 - 10.5 K/uL   RBC 3.65 (L) 3.87 - 5.11 MIL/uL   Hemoglobin 10.7 (L) 12.0 - 15.0 g/dL   HCT 16.132.4 (L) 09.636.0 - 04.546.0 %   MCV 88.8 80.0 - 100.0 fL   MCH 29.3 26.0 - 34.0 pg   MCHC 33.0 30.0 - 36.0 g/dL   RDW 40.913.7 81.111.5 - 91.415.5 %   Platelets 276 150 - 400 K/uL   nRBC 0.0 0.0 - 0.2 %  Comprehensive metabolic panel     Status: Abnormal   Collection Time: 02/11/19  3:08 PM  Result Value Ref Range   Sodium 138 135 - 145 mmol/L   Potassium 3.8 3.5 - 5.1 mmol/L   Chloride 105 98 - 111 mmol/L   CO2 24 22 - 32 mmol/L   Glucose, Bld 101 (H) 70 - 99 mg/dL   BUN 7 6 - 20 mg/dL   Creatinine, Ser 7.820.65 0.44 - 1.00 mg/dL   Calcium 9.1 8.9 - 95.610.3 mg/dL   Total Protein 6.7 6.5 - 8.1 g/dL   Albumin 3.7 3.5 - 5.0 g/dL   AST 18 15 - 41 U/L   ALT 16 0 - 44 U/L   Alkaline Phosphatase 40 38 - 126 U/L   Total Bilirubin 0.4 0.3 - 1.2 mg/dL   GFR calc non Af Amer >60 >60 mL/min   GFR calc Af Amer >60 >60 mL/min   Anion gap 9 5 - 15  hCG, quantitative, pregnancy     Status: Abnormal   Collection Time: 02/11/19  3:08 PM  Result Value Ref Range   hCG, Beta Chain, Quant, S 1,808 (H) <5 mIU/mL   [place] Ob Less Than 14 Weeks With Ob Transvaginal  Result Date: 02/11/2019 CLINICAL DATA:  38 year old female reportedly 8 weeks 3 days pregnant with a history of known left ectopic pregnancy. She has undergone 2 methotrexate injections and has had pain for the past 3 days. EXAM: OBSTETRIC <14 WK US AND TRANSVAGINAL OB US TECHNIQUE: Both  transabdominal and transvaginal ultrasound examinations were  performed for complete evaluation of the gestation as well as the maternal uterus, adnexal regions, and pelvic cul-de-sac. Transvaginal technique was performed to assess early pregnancy. COMPARISON:  None. FINDINGS: Intrauterine gestational sac: None Yolk sac:  Not Visualized. Embryo:  Not Visualized. Cardiac Activity: Not Visualized. Maternal uterus/adnexae: The uterus is normal in appearance. The right ovary is also normal in appearance. A heterogeneous soft tissue mass is present in the left adnexal region measuring approximately 6.5 x 3.7 x 5.6 cm. Normal ovary is inseparable from the structure. There is no evidence of internal vascularity. Small volume free fluid layers in the pelvis. IMPRESSION: 1. Approximately 6.5 x 3.7 x 5.6 cm heterogeneous mass in the region of the left adnexa inseparable from the ovary. No evidence of internal vascularity. Findings may represent focal hematoma related to the known ectopic pregnancy versus a potentially torsed left ovary. 2. Small volume of free simple fluid layers in the anatomic pelvis. 3. The uterus and right ovary are unremarkable in appearance. Electronically Signed   By: Jacqulynn Cadet M.D.   On: 02/11/2019 14:53   MAU Course  Procedures  MDM -r/o ruptured ectopic -CBC: H/H 10.7/32.4, RBCs 3.65, otherwise WNL -CMP: WNL -hCG: 7253 -[place]: large heterogeneous mass in area of left adnexa, suspect hematoma or ovarian torsion -spoke with Dr. Murrell Redden @1520 , relayed [place] results (labs pending at time of conversation), per Dr. Murrell Redden, will take to OR for surgical resolution, care transferred to Dr. Murrell Redden -discussed plan of care with pt and husband, advised that I cannot answer any questions for them about the surgery, but that Dr. Murrell Redden will be here in about 30-86min to discuss with them in greater detail -pt and husband questions extensively asked and answered -pt to OR and admission for  surgery  Orders Placed This Encounter  Procedures  . SARS Coronavirus 2 (CEPHEID - Performed in [place] hospital lab), Hosp Order    Standing Status:   Standing    Number of Occurrences:   1    Order Specific Question:   Rule Out    Answer:   Yes  . US OB LESS THAN 14 WEEKS WITH OB TRANSVAGINAL    Standing Status:   Standing    Number of Occurrences:   1    Order Specific Question:   Symptom/Reason for Exam    Answer:   Ectopic pregnancy [[id number]]    Order Specific Question:   Symptom/Reason for Exam    Answer:   Pelvic pain in pregnancy [[id number]]  . CBC    Standing Status:   Standing    Number of Occurrences:   1  . Comprehensive metabolic panel    Standing Status:   Standing    Number of Occurrences:   1  . hCG, quantitative, pregnancy    Standing Status:   Standing    Number of Occurrences:   1  . RPR    Standing Status:   Standing    Number of Occurrences:   1  . Place and maintain sequential compression device    Standing Status:   Standing    Number of Occurrences:   1  . Type and screen [place]     Standing Status:   Standing    Number of Occurrences:   1  . Insert peripheral IV    Standing Status:   Standing    Number of Occurrences:   1   Meds ordered this encounter  Medications  . lactated ringers bolus 1,000 mL  . lactated ringers infusion   Assessment and Plan   -pt admission to OR for surgery -care transferred to Dr. Amado NashAlmquist @1520   Odie SeraNicole E Dierdra Pearson 02/11/2019, 4:27 PM

## 2019-02-11 NOTE — Discharge Summary (Signed)
Physician Discharge Summary  Patient ID: Casey Pearson MRN: [id number] DOB/AGE: [birth year] 38 y.o. G2P1001  Admit date: 02/11/2019 Discharge date: 02/11/2019  Admission Diagnoses: ectopic pregnancy with large adnexal mass, failed medical management   Discharge Diagnoses:  Active Problems:   * No active hospital problems. *   Discharged Condition: good  Hospital Course: The patient underwent and uncomplicated l/s LSO.  Decision to remove ovary d/t abnormal appearance concern that ectopic could have been involving the ovary.  Consults: None  Significant Diagnostic Studies: labs: bhcg 1800  Treatments: surgery: as noted  Discharge Exam: Blood pressure (!) 103/57, pulse 100, temperature (!) 96.8 F (36 C), resp. rate 16, height 4\' 11"  (1.499 m), weight 58 kg, last menstrual period 12/14/2018, SpO2 100 %.   Disposition: Discharge disposition: 01-Home or Self Care       Discharge Instructions    Call MD for:  difficulty breathing, headache or visual disturbances   Complete by: As directed    Call MD for:  extreme fatigue   Complete by: As directed    Call MD for:  hives   Complete by: As directed    Call MD for:  persistant dizziness or light-headedness   Complete by: As directed    Call MD for:  persistant nausea and vomiting   Complete by: As directed    Call MD for:  redness, tenderness, or signs of infection (pain, swelling, redness, odor or green/yellow discharge around incision site)   Complete by: As directed    Call MD for:  severe uncontrolled pain   Complete by: As directed    Call MD for:  temperature >100.4   Complete by: As directed    Diet - low sodium heart healthy   Complete by: As directed    Discharge instructions   Complete by: As directed    No heavy lifting or pushing Pelvic rest x6 wks   Increase activity slowly   Complete by: As directed      Allergies as of 02/11/2019   No Known Allergies     Medication List    TAKE these  medications   HYDROcodone-acetaminophen 5-325 MG tablet Commonly known as: NORCO/VICODIN Take 1 tablet by mouth every 6 (six) hours as needed for moderate pain.   ibuprofen 800 MG tablet Commonly known as: ADVIL Take 1 tablet (800 mg total) by mouth every 8 (eight) hours as needed.      Follow-up Information    Domanic Matusek, Earlyne Iba, MD Follow up.   Specialty: Obstetrics and Gynecology Contact information: [place] [address] [phone]           Signed: Charyl Bigger 02/11/2019, 8:22 PM

## 2019-02-12 ENCOUNTER — Encounter (HOSPITAL_COMMUNITY): Payer: Self-pay | Admitting: Obstetrics and Gynecology

## 2019-02-12 LAB — RPR: RPR Ser Ql: NONREACTIVE

## 2019-11-24 ENCOUNTER — Ambulatory Visit: Payer: 59

## 2019-12-01 ENCOUNTER — Ambulatory Visit: Payer: 59 | Attending: Internal Medicine

## 2019-12-01 DIAGNOSIS — Z23 Encounter for immunization: Secondary | ICD-10-CM

## 2019-12-01 NOTE — Progress Notes (Signed)
   Covid-19 Vaccination Clinic  Name:  Casey Pearson    MRN: [id number] DOB: 1980-12-22  12/01/2019  Casey Pearson was observed post Covid-19 immunization for 15 minutes without incident. She was provided with Vaccine Information Sheet and instruction to access the V-Safe system.   Casey Pearson was instructed to call 911 with any severe reactions post vaccine: Marland Kitchen Difficulty breathing  . Swelling of face and throat  . A fast heartbeat  . A bad rash all over body  . Dizziness and weakness   Immunizations Administered    Name Date Dose VIS Date Route   Pfizer COVID-19 Vaccine 12/01/2019  3:08 PM 0.3 mL 07/28/2019 Intramuscular   Manufacturer: [employer], [employer]   Lot: [id number]   NDC: [address]-6

## 2019-12-25 ENCOUNTER — Ambulatory Visit: Payer: 59 | Attending: Internal Medicine

## 2019-12-25 DIAGNOSIS — Z23 Encounter for immunization: Secondary | ICD-10-CM

## 2019-12-25 NOTE — Progress Notes (Signed)
   Covid-19 Vaccination Clinic  Name:  Casey Pearson    MRN: [id number] DOB: June 09, 1981  12/25/2019  [name] was observed post Covid-19 immunization for 15 minutes without incident. She was provided with Vaccine Information Sheet and instruction to access the V-Safe system.   [name] was instructed to call 911 with any severe reactions post vaccine: Marland Kitchen Difficulty breathing  . Swelling of face and throat  . A fast heartbeat  . A bad rash all over body  . Dizziness and weakness   Immunizations Administered    Name Date Dose VIS Date Route   Pfizer COVID-19 Vaccine 12/25/2019  3:13 PM 0.3 mL 10/11/2018 Intramuscular   Manufacturer: [employer], [employer]   Lot: [id number]   NDC: [address]-5

## 2020-06-27 LAB — LIPID PANEL
Cholesterol: 178 (ref 0–200)
HDL: 44 (ref 35–70)
LDL Cholesterol: 118
Triglycerides: 148 (ref 40–160)

## 2020-06-27 LAB — TSH: TSH: 2.3 (ref <=5.90)

## 2021-03-04 ENCOUNTER — Other Ambulatory Visit (INDEPENDENT_AMBULATORY_CARE_PROVIDER_SITE_OTHER): Payer: 59

## 2021-03-04 ENCOUNTER — Encounter: Payer: Self-pay | Admitting: Gastroenterology

## 2021-03-04 ENCOUNTER — Other Ambulatory Visit: Payer: Self-pay

## 2021-03-04 ENCOUNTER — Ambulatory Visit (INDEPENDENT_AMBULATORY_CARE_PROVIDER_SITE_OTHER): Payer: 59 | Admitting: Gastroenterology

## 2021-03-04 VITALS — BP 110/70 | HR 80 | Ht 61.0 in | Wt 117.1 lb

## 2021-03-04 DIAGNOSIS — R1012 Left upper quadrant pain: Secondary | ICD-10-CM

## 2021-03-04 DIAGNOSIS — K581 Irritable bowel syndrome with constipation: Secondary | ICD-10-CM | POA: Diagnosis not present

## 2021-03-04 MED ORDER — DICYCLOMINE HCL 10 MG PO CAPS: 10.0000 mg | ORAL_CAPSULE | Freq: Four times a day (QID) | ORAL | 3 refills | Status: DC

## 2021-03-04 NOTE — Progress Notes (Signed)
Chief Complaint: Abdo pain  Referring Provider:  Jaynie Crumble, PA*      ASSESSMENT AND PLAN;   #1. 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 Neg NCCT as below  #2. IBS-C  #3. Previous vitamin B-12 deficiency.  Plan: -Check CBC, CMP, Vit B12, celiac sceen, CRP -Resume MiraLAX half a cap/day -Continue probiotics -Use bentyl 10mg  po QID PRN #120 2 refills. -Since she does not have any red flag symptoms, hold off on endoscopic eval. if she continues to have problems or has any red flag symptoms, we could always reconsider at any time.  Otherwise routine screening colonoscopy at age 71. -FU in 6 months.    HPI:    Casey Pearson is a 40 y.o. female  Accompanied by her husband  2 distinct episodes of LUQ pain crampy pain First episode 6 months ago lasting 10 days and then most recently last month when she went to urgent care center and was given a trial of Bentyl which did help a lot.  She has longstanding history of constipation " all her life" with pellet-like stools.  Abdominal bloating, sharp intermittent LUQ pain which gets better with defecation.  No nausea, vomiting, weight loss, melena or hematochezia.  Had x-ray KUB which showed constipation.  She also had a noncontrast CT as below January 2022 for hematuria which was negative for any acute abnormalities.  She has been under considerable stress lately since her dad passed away due to CVA at age 55 in 74  Nl CBC (Hb 12.7), CMP, lipase, TSH 06/2020  Low Vit B12, given p.o. supplements  No definite history of lactose or gluten intolerance.  She does drink plenty of water.  And lately has started walking as well.  Past GI work-up: NCCT (renal stone protocol) 09/12/2020 for hematuria IMPRESSION:  No acute intra-abdominal or intrapelvic pathology on a noncontrast  exam. No evidence of renal calculi or hydronephrosis.    Past Medical History:  Diagnosis Date   Medical history non-contributory      Past Surgical History:  Procedure Laterality Date   APPENDECTOMY     CESAREAN SECTION     DIAGNOSTIC LAPAROSCOPY WITH REMOVAL OF ECTOPIC PREGNANCY N/A 02/11/2019   Procedure: Laparoscopic LEFT Salpingo Oophorectomy AND REMOVAL OF ECTOPIC PREGNANCY;  Surgeon: 02/13/2019, MD;  Location: [facility] OR;  Service: Gynecology;  Laterality: N/A;    Family History  Problem Relation Age of Onset   Diabetes Mother    Hypertension Father    Pancreatic cancer Neg Hx    Colon cancer Neg Hx    Esophageal cancer Neg Hx    Liver disease Neg Hx    Stomach cancer Neg Hx     Social History   Tobacco Use   Smoking status: Never   Smokeless tobacco: Never  Vaping Use   Vaping Use: Never used  Substance Use Topics   Alcohol use: Never   Drug use: Never    Current Outpatient Medications  Medication Sig Dispense Refill   cyanocobalamin 1000 MCG tablet Take 1 tablet by mouth daily.     ergocalciferol (VITAMIN D2) 1.25 MG (50000 UT) capsule Take 1 capsule by mouth once a week.     famotidine (PEPCID) 20 MG tablet Take 1 tablet by mouth as needed.     polyethylene glycol powder (GLYCOLAX/MIRALAX) 17 GM/SCOOP powder Take 17 g by mouth as needed.     No current facility-administered medications for this visit.    No Known Allergies  Review of Systems:  Constitutional: Denies fever, chills, diaphoresis, appetite change and fatigue.  HEENT: Denies photophobia, eye pain, redness, hearing loss, ear pain, congestion, sore throat, rhinorrhea, sneezing, mouth sores, neck pain, neck stiffness and tinnitus.   Respiratory: Denies SOB, DOE, cough, chest tightness,  and wheezing.   Cardiovascular: Denies chest pain, palpitations and leg swelling.  Genitourinary: Denies dysuria, urgency, frequency, hematuria, flank pain and difficulty urinating.  Musculoskeletal: Denies myalgias, back pain, joint swelling, arthralgias and gait problem.  Skin: No rash.  Neurological: Denies dizziness, seizures, syncope,  weakness, light-headedness, numbness and headaches.  Hematological: Denies adenopathy. Easy bruising, personal or family bleeding history  Psychiatric/Behavioral: No anxiety or depression     Physical Exam:    BP 110/70   Pulse 80   Ht 5\' 1"  (1.549 m)   Wt 117 lb 2 oz (53.1 kg)   SpO2 98%   BMI 22.13 kg/m  Wt Readings from Last 3 Encounters:  03/04/21 117 lb 2 oz (53.1 kg)  02/11/19 127 lb 14.4 oz (58 kg)  02/02/19 129 lb (58.5 kg)   Constitutional:  Well-developed, in no acute distress. Psychiatric: Normal mood and affect. Behavior is normal. HEENT: Pupils normal.  Conjunctivae are normal. No scleral icterus. Neck supple.  Cardiovascular: Normal rate, regular rhythm. No edema Pulmonary/chest: Effort normal and breath sounds normal. No wheezing, rales or rhonchi. Abdominal: Soft, nondistended. Nontender. Bowel sounds active throughout. There are no masses palpable. No hepatomegaly. Rectal: Deferred Neurological: Alert and oriented to person place and time. Skin: Skin is warm and dry. No rashes noted.  Data Reviewed: I have personally reviewed following labs and imaging studies  CBC: CBC Latest Ref Rng & Units 02/11/2019 02/02/2019 01/27/2019  WBC 4.0 - 10.5 K/uL 9.5 6.9 11.1(H)  Hemoglobin 12.0 - 15.0 g/dL 10.7(L) 11.7(L) 12.2  Hematocrit 36.0 - 46.0 % 32.4(L) 35.0(L) 36.7  Platelets 150 - 400 K/uL 276 289 285    CMP: CMP Latest Ref Rng & Units 02/11/2019 02/02/2019 01/27/2019  Glucose 70 - 99 mg/dL 03/29/2019) 93 045(W)  BUN 6 - 20 mg/dL 7 098(J) 8  Creatinine <1(B - 1.00 mg/dL 1.47 8.29 5.62  Sodium 135 - 145 mmol/L 138 137 137  Potassium 3.5 - 5.1 mmol/L 3.8 4.0 3.9  Chloride 98 - 111 mmol/L 105 106 105  CO2 22 - 32 mmol/L 24 24 24   Calcium 8.9 - 10.3 mg/dL 9.1 9.5 9.4  Total Protein 6.5 - 8.1 g/dL 6.7 6.9 7.1  Total Bilirubin 0.3 - 1.2 mg/dL 0.4 0.5 0.6  Alkaline Phos 38 - 126 U/L 40 41 46  AST 15 - 41 U/L 18 15 20   ALT 0 - 44 U/L 16 14 16    CT reviewed     1.30, MD 03/04/2021, 2:25 PM  Cc: , PA*

## 2021-03-04 NOTE — Patient Instructions (Signed)
If you are age 39 or older, your body mass index should be between 23-30. Your Body mass index is 22.13 kg/m. If this is out of the aforementioned range listed, please consider follow up with your Primary Care Provider.  If you are age 74 or younger, your body mass index should be between 19-25. Your Body mass index is 22.13 kg/m. If this is out of the aformentioned range listed, please consider follow up with your Primary Care Provider.   __________________________________________________________  The [facility] providers would like to encourage you to use [facility] to communicate with providers for non-urgent requests or questions.  Due to long hold times on the telephone, sending your provider a message by [facility] may be a faster and more efficient way to get a response.  Please allow 48 business hours for a response.  Please remember that this is for non-urgent requests.   Please go to the lab on the 2nd floor [address] before you leave the office today.   Continue probiotics  We have sent the following medications to your pharmacy for you to pick up at your convenience: Bentyl  Please call in 6 months to schedule follow up.  Thank you,  Dr. Lynann Bologna

## 2021-03-07 ENCOUNTER — Telehealth: Payer: Self-pay | Admitting: Gastroenterology

## 2021-03-07 NOTE — Telephone Encounter (Signed)
Patents husband calling to obtain lab results

## 2021-03-07 NOTE — Telephone Encounter (Signed)
Normal labs including CBC, CMP, B12, CRP and celiac screen. This is really good news. Plan as per last note.  RG

## 2021-03-08 NOTE — Telephone Encounter (Signed)
No need to avoid any foods as yet. Just avoid nonsteroidals Please continue walking every day and drink plenty of water If you still have problems, then we will put you on some special diet like FODMAP diet RG

## 2021-03-10 NOTE — Telephone Encounter (Signed)
Spoke with patient, patient made aware of results. Patient verbalized understanding and nothing further needed at this time 

## 2021-03-11 LAB — CBC WITH DIFFERENTIAL/PLATELET
Basophils Absolute: 0 10*3/uL (ref 0.0–0.2)
Basos: 0 %
EOS (ABSOLUTE): 0.1 10*3/uL (ref 0.0–0.4)
Eos: 1 %
Hematocrit: 37.1 % (ref 34.0–46.6)
Hemoglobin: 12.2 g/dL (ref 11.1–15.9)
Immature Grans (Abs): 0 10*3/uL (ref 0.0–0.1)
Immature Granulocytes: 0 %
Lymphocytes Absolute: 1.4 10*3/uL (ref 0.7–3.1)
Lymphs: 21 %
MCH: 29.6 pg (ref 26.6–33.0)
MCHC: 32.9 g/dL (ref 31.5–35.7)
MCV: 90 fL (ref 79–97)
Monocytes Absolute: 0.5 10*3/uL (ref 0.1–0.9)
Monocytes: 7 %
Neutrophils Absolute: 4.9 10*3/uL (ref 1.4–7.0)
Neutrophils: 71 %
Platelets: 239 10*3/uL (ref 150–450)
RBC: 4.12 x10E6/uL (ref 3.77–5.28)
RDW: 12.5 % (ref 11.7–15.4)
WBC: 6.9 10*3/uL (ref 3.4–10.8)

## 2021-03-11 LAB — COMPREHENSIVE METABOLIC PANEL
ALT: 6 IU/L (ref 0–32)
AST: 12 IU/L (ref 0–40)
Albumin/Globulin Ratio: 1.9 (ref 1.2–2.2)
Albumin: 4.2 g/dL (ref 3.8–4.8)
Alkaline Phosphatase: 54 IU/L (ref 44–121)
BUN/Creatinine Ratio: 9 (ref 9–23)
BUN: 5 mg/dL — ABNORMAL LOW (ref 6–20)
Bilirubin Total: 0.2 mg/dL (ref 0.0–1.2)
CO2: 24 mmol/L (ref 20–29)
Calcium: 9 mg/dL (ref 8.7–10.2)
Chloride: 101 mmol/L (ref 96–106)
Creatinine, Ser: 0.57 mg/dL (ref 0.57–1.00)
Globulin, Total: 2.2 g/dL (ref 1.5–4.5)
Glucose: 93 mg/dL (ref 65–99)
Potassium: 3.7 mmol/L (ref 3.5–5.2)
Sodium: 140 mmol/L (ref 134–144)
Total Protein: 6.4 g/dL (ref 6.0–8.5)
eGFR: 119 mL/min/{1.73_m2} (ref >59)

## 2021-03-11 LAB — CELIAC PANEL 10
Antigliadin Abs, IgA: 2 units (ref 0–19)
Endomysial IgA: NEGATIVE
Gliadin IgG: 1 units (ref 0–19)
IgA/Immunoglobulin A, Serum: 162 mg/dL (ref 87–352)
Tissue Transglut Ab: <2 U/mL (ref 0–5)
Transglutaminase IgA: <2 U/mL (ref 0–3)

## 2021-03-11 LAB — C-REACTIVE PROTEIN: CRP: <1 mg/L (ref 0–10)

## 2021-03-11 LAB — VITAMIN B12: Vitamin B-12: 685 pg/mL (ref 232–1245)

## 2021-08-16 ENCOUNTER — Encounter (HOSPITAL_BASED_OUTPATIENT_CLINIC_OR_DEPARTMENT_OTHER): Payer: Self-pay | Admitting: Emergency Medicine

## 2021-08-16 ENCOUNTER — Emergency Department (HOSPITAL_BASED_OUTPATIENT_CLINIC_OR_DEPARTMENT_OTHER)
Admission: EM | Admit: 2021-08-16 | Discharge: 2021-08-16 | Disposition: A | Discharge status: Home / Self Care | Payer: 59 | Attending: Emergency Medicine | Admitting: Emergency Medicine

## 2021-08-16 ENCOUNTER — Emergency Department (HOSPITAL_BASED_OUTPATIENT_CLINIC_OR_DEPARTMENT_OTHER): Payer: 59

## 2021-08-16 ENCOUNTER — Other Ambulatory Visit: Payer: Self-pay

## 2021-08-16 DIAGNOSIS — R110 Nausea: Secondary | ICD-10-CM | POA: Diagnosis not present

## 2021-08-16 DIAGNOSIS — R5383 Other fatigue: Secondary | ICD-10-CM | POA: Diagnosis not present

## 2021-08-16 DIAGNOSIS — M546 Pain in thoracic spine: Secondary | ICD-10-CM | POA: Insufficient documentation

## 2021-08-16 DIAGNOSIS — R42 Dizziness and giddiness: Secondary | ICD-10-CM | POA: Diagnosis present

## 2021-08-16 LAB — CBC WITH DIFFERENTIAL/PLATELET
Abs Immature Granulocytes: 0.04 10*3/uL (ref 0.00–0.07)
Basophils Absolute: 0 10*3/uL (ref 0.0–0.1)
Basophils Relative: 0 %
Eosinophils Absolute: 0 10*3/uL (ref 0.0–0.5)
Eosinophils Relative: 0 %
HCT: 35.5 % — ABNORMAL LOW (ref 36.0–46.0)
Hemoglobin: 12 g/dL (ref 12.0–15.0)
Immature Granulocytes: 0 %
Lymphocytes Relative: 13 %
Lymphs Abs: 1.3 10*3/uL (ref 0.7–4.0)
MCH: 29.6 pg (ref 26.0–34.0)
MCHC: 33.8 g/dL (ref 30.0–36.0)
MCV: 87.7 fL (ref 80.0–100.0)
Monocytes Absolute: 0.4 10*3/uL (ref 0.1–1.0)
Monocytes Relative: 5 %
Neutro Abs: 7.6 10*3/uL (ref 1.7–7.7)
Neutrophils Relative %: 82 %
Platelets: 235 10*3/uL (ref 150–400)
RBC: 4.05 MIL/uL (ref 3.87–5.11)
RDW: 12.5 % (ref 11.5–15.5)
WBC: 9.4 10*3/uL (ref 4.0–10.5)
nRBC: 0 % (ref 0.0–0.2)

## 2021-08-16 LAB — COMPREHENSIVE METABOLIC PANEL
ALT: 11 U/L (ref 0–44)
AST: 18 U/L (ref 15–41)
Albumin: 3.9 g/dL (ref 3.5–5.0)
Alkaline Phosphatase: 43 U/L (ref 38–126)
Anion gap: 9 (ref 5–15)
BUN: 13 mg/dL (ref 6–20)
CO2: 23 mmol/L (ref 22–32)
Calcium: 8.7 mg/dL — ABNORMAL LOW (ref 8.9–10.3)
Chloride: 100 mmol/L (ref 98–111)
Creatinine, Ser: 0.51 mg/dL (ref 0.44–1.00)
GFR, Estimated: >60 mL/min (ref >60)
Glucose, Bld: 113 mg/dL — ABNORMAL HIGH (ref 70–99)
Potassium: 3.9 mmol/L (ref 3.5–5.1)
Sodium: 132 mmol/L — ABNORMAL LOW (ref 135–145)
Total Bilirubin: 0.5 mg/dL (ref 0.3–1.2)
Total Protein: 7.1 g/dL (ref 6.5–8.1)

## 2021-08-16 LAB — URINALYSIS, ROUTINE W REFLEX MICROSCOPIC
Bilirubin Urine: NEGATIVE
Glucose, UA: NEGATIVE mg/dL
Ketones, ur: 80 mg/dL — AB
Leukocytes,Ua: NEGATIVE
Nitrite: NEGATIVE
Protein, ur: NEGATIVE mg/dL
Specific Gravity, Urine: 1.015 (ref 1.005–1.030)
pH: 7 (ref 5.0–8.0)

## 2021-08-16 LAB — CBG MONITORING, ED: Glucose-Capillary: 89 mg/dL (ref 70–99)

## 2021-08-16 LAB — PREGNANCY, URINE: Preg Test, Ur: NEGATIVE

## 2021-08-16 LAB — URINALYSIS, MICROSCOPIC (REFLEX)

## 2021-08-16 MED ORDER — ONDANSETRON 4 MG PO TBDP
4.0000 mg | ORAL_TABLET | Freq: Once | ORAL | Status: AC
  Administered 2021-08-16: 4 mg via ORAL
  Filled 2021-08-16: qty 1

## 2021-08-16 MED ORDER — LACTATED RINGERS IV BOLUS
1000.0000 mL | Freq: Once | INTRAVENOUS | Status: AC
  Administered 2021-08-16: 1000 mL via INTRAVENOUS

## 2021-08-16 NOTE — ED Provider Notes (Signed)
[facility] EMERGENCY DEPARTMENT Provider Note   CSN: [id number] Arrival date & time: 08/16/21  1709     History Chief Complaint  Patient presents with   Dizziness    Casey Pearson is a 40 y.o. female.   Dizziness Associated symptoms: nausea   Associated symptoms: no chest pain, no diarrhea, no headaches, no palpitations, no vomiting and no weakness   Patient presents for episodes of fatigue, dizziness, somnolence, starting last night and continuing today.  She was recently having some muscular pain in her right upper back.  She was seen in urgent care and prescribed Relafen, tramadol, and tizanidine.  She took all 3 of these medications last night.  This was prior to the onset of her dizziness and fatigue.  This morning, she took additional Relafen and tramadol.  Last dose of any medications was around noon.  Due to her somnolence and dizziness, she presented back to urgent care.  Urgent care sent her to the ED.  Currently, she endorses continued fatigue.  She does report that her symptoms have improved throughout the course of the evening.  She is otherwise in good underlying health with no known chronic medical conditions.  Her last menstrual period was 2 weeks ago.  She has not had any other known blood loss.  She has not experienced any recent chest discomfort.    Past Medical History:  Diagnosis Date   Medical history non-contributory     Patient Active Problem List   Diagnosis Date Noted   Ectopic pregnancy 02/02/2019    Past Surgical History:  Procedure Laterality Date   APPENDECTOMY     CESAREAN SECTION     DIAGNOSTIC LAPAROSCOPY WITH REMOVAL OF ECTOPIC PREGNANCY N/A 02/11/2019   Procedure: Laparoscopic LEFT Salpingo Oophorectomy AND REMOVAL OF ECTOPIC PREGNANCY;  Surgeon: Charyl Bigger, MD;  Location: [facility];  Service: Gynecology;  Laterality: N/A;     OB History     Gravida  2   Para  1   Term  1   Preterm      AB      Living  1       SAB      IAB      Ectopic      Multiple      Live Births  1        Obstetric Comments  Failed to dilate, del in [place]         Family History  Problem Relation Age of Onset   Diabetes Mother    Hypertension Father    Pancreatic cancer Neg Hx    Colon cancer Neg Hx    Esophageal cancer Neg Hx    Liver disease Neg Hx    Stomach cancer Neg Hx     Social History   Tobacco Use   Smoking status: Never   Smokeless tobacco: Never  Vaping Use   Vaping Use: Never used  Substance Use Topics   Alcohol use: Never   Drug use: Never    Home Medications Prior to Admission medications   Medication Sig Start Date End Date Taking? Authorizing Provider  cyanocobalamin 1000 MCG tablet Take 1 tablet by mouth daily.    [provider]  dicyclomine (BENTYL) 10 MG capsule Take 1 capsule (10 mg total) by mouth QID. 03/04/21   Jackquline Denmark, MD  ergocalciferol (VITAMIN D2) 1.25 MG (50000 UT) capsule Take 1 capsule by mouth once a week. 07/31/20   [provider]  famotidine (  PEPCID) 20 MG tablet Take 1 tablet by mouth as needed. 09/05/20   [provider]  polyethylene glycol powder (GLYCOLAX/MIRALAX) 17 GM/SCOOP powder Take 17 g by mouth as needed. 10/08/20   [provider]    Allergies    Patient has no known allergies.  Review of Systems   Review of Systems  Constitutional:  Positive for activity change and fatigue. Negative for chills and fever.  HENT:  Negative for congestion, ear pain and sore throat.   Eyes:  Negative for pain and visual disturbance.  Respiratory:  Negative for cough and chest tightness.   Cardiovascular:  Negative for chest pain and palpitations.  Gastrointestinal:  Positive for nausea. Negative for abdominal pain, diarrhea and vomiting.  Genitourinary:  Negative for dysuria, flank pain, hematuria, pelvic pain and vaginal bleeding.  Musculoskeletal:  Negative for arthralgias, back pain, gait problem, joint swelling and  neck pain.  Skin:  Negative for color change and rash.  Neurological:  Positive for dizziness and light-headedness. Negative for tremors, seizures, syncope, speech difficulty, weakness, numbness and headaches.  Hematological:  Does not bruise/bleed easily.  Psychiatric/Behavioral:  Negative for confusion and decreased concentration.   All other systems reviewed and are negative.  Physical Exam Updated Vital Signs BP (!) 124/57 (BP Location: Left Arm)    Pulse 91    Temp 97.9 F (36.6 C) (Oral)    Resp 16    Ht 5\' 2"  (1.575 m)    Wt 52.6 kg    LMP 08/02/2021    SpO2 100%    Breastfeeding No    BMI 21.22 kg/m   Physical Exam Vitals and nursing note reviewed.  Constitutional:      General: She is not in acute distress.    Appearance: Normal appearance. She is well-developed and normal weight. She is not ill-appearing, toxic-appearing or diaphoretic.  HENT:     Head: Normocephalic and atraumatic.     Right Ear: External ear normal.     Left Ear: External ear normal.     Nose: Nose normal.     Mouth/Throat:     Mouth: Mucous membranes are moist.     Pharynx: Oropharynx is clear.  Eyes:     General: No scleral icterus.    Extraocular Movements: Extraocular movements intact.     Conjunctiva/sclera: Conjunctivae normal.  Cardiovascular:     Rate and Rhythm: Normal rate and regular rhythm.     Heart sounds: No murmur heard. Pulmonary:     Effort: Pulmonary effort is normal. No respiratory distress.     Breath sounds: Normal breath sounds. No wheezing or rales.  Chest:     Chest wall: No tenderness.  Abdominal:     Palpations: Abdomen is soft.     Tenderness: There is no abdominal tenderness. There is no right CVA tenderness or left CVA tenderness.  Musculoskeletal:        General: No swelling. Normal range of motion.     Cervical back: Normal range of motion and neck supple. No rigidity or tenderness.     Right lower leg: No edema.     Left lower leg: No edema.  Skin:     General: Skin is warm and dry.     Capillary Refill: Capillary refill takes less than 2 seconds.     Coloration: Skin is not jaundiced or pale.  Neurological:     General: No focal deficit present.     Mental Status: She is alert and oriented to person,  place, and time.     Cranial Nerves: No cranial nerve deficit.     Sensory: No sensory deficit.     Motor: No weakness.     Coordination: Coordination normal.  Psychiatric:        Mood and Affect: Mood normal.        Behavior: Behavior normal.        Thought Content: Thought content normal.        Judgment: Judgment normal.    ED Results / Procedures / Treatments   Labs (all labs ordered are listed, but only abnormal results are displayed) Labs Reviewed  CBC WITH DIFFERENTIAL/PLATELET - Abnormal; Notable for the following components:      Result Value   HCT 35.5 (*)    All other components within normal limits  COMPREHENSIVE METABOLIC PANEL - Abnormal; Notable for the following components:   Sodium 132 (*)    Glucose, Bld 113 (*)    Calcium 8.7 (*)    All other components within normal limits  URINALYSIS, ROUTINE W REFLEX MICROSCOPIC - Abnormal; Notable for the following components:   APPearance HAZY (*)    Hgb urine dipstick SMALL (*)    Ketones, ur 80 (*)    All other components within normal limits  URINALYSIS, MICROSCOPIC (REFLEX) - Abnormal; Notable for the following components:   Bacteria, UA FEW (*)    All other components within normal limits  PREGNANCY, URINE  CBG MONITORING, ED    EKG EKG Interpretation  Date/Time:  Saturday August 16 2021 22:00:18 EST Ventricular Rate:  91 PR Interval:  125 QRS Duration: 84 QT Interval:  348 QTC Calculation: 429 R Axis:   47 Text Interpretation: Sinus rhythm Abnormal R-wave progression, early transition Borderline T wave abnormalities Confirmed by Godfrey Pick [phone]) on 08/16/2021 10:13:53 PM  Radiology DG Chest Port 1 View  Result Date: 08/16/2021 CLINICAL DATA:   Dizziness EXAM: PORTABLE CHEST 1 VIEW COMPARISON:  None. FINDINGS: The heart size and mediastinal contours are within normal limits. Both lungs are clear. The visualized skeletal structures are unremarkable. IMPRESSION: No active disease. Electronically Signed   By: Fidela Salisbury M.D.   On: 08/16/2021 21:57    Procedures Procedures   Medications Ordered in ED Medications  ondansetron (ZOFRAN-ODT) disintegrating tablet 4 mg (4 mg Oral Given 08/16/21 2035)  lactated ringers bolus 1,000 mL ( Intravenous Stopped 08/16/21 2252)    ED Course  I have reviewed the triage vital signs and the nursing notes.  Pertinent labs & imaging results that were available during my care of the patient were reviewed by me and considered in my medical decision making (see chart for details).    MDM Rules/Calculators/A&P                         Patient is a healthy 40 year old female presenting for episodes of dizziness, somnolence, and fatigue last night and today.  This is in the setting of starting on multiple medications for treatment of a muscular pain in her back.  She is not normally on any medications.  She is well-appearing on arrival in the ED.  She has no focal neurologic deficits.  Vital signs are notable for soft DBP.  I suspect polypharmacy is the likely etiology of her symptoms.  Diagnostic work-up was initiated to assess for any other underlying reasons for her recent symptoms.  Bolus of IV fluids was ordered.  Patient's work-up was reassuring.  Following the bolus of  IV fluids, she reported significant improvement in her symptoms.  This is likely also due to having time to further metabolize the recent medications.  Patient was able to ambulate to the bathroom without any difficulty.  She is tolerating p.o. intake in the ED.  She does feel comfortable with discharge home at this time.  Patient was discharged in good condition.  Final Clinical Impression(s) / ED Diagnoses Final diagnoses:  Dizziness     Rx / DC Orders ED Discharge Orders     None        Godfrey Pick, MD 08/17/21 1422

## 2021-08-16 NOTE — ED Notes (Signed)
Pt states continued dizziness and worsening SOB, states feeling sleepy,  Took relafen, tramadol, tizanidine  VS wnl

## 2021-08-16 NOTE — ED Triage Notes (Signed)
Pt sent from [facility] urgent care for further eval of dizziness and back spasms. Pt seen at an Urgent care earlier this week for back spasms and given prescribed medications. Pt began to experience dizziness after taking prescribed medications.

## 2021-08-16 NOTE — ED Notes (Signed)
Pt to ED via [facility] with c/o dizziness after taking medications for back spasms

## 2021-08-16 NOTE — ED Notes (Signed)
Pt tolerating graham cracker po

## 2021-08-16 NOTE — ED Notes (Signed)
Pt drinking water and eating graham crackers

## 2021-08-16 NOTE — ED Notes (Signed)
Checked CBG 89, RN Adrienne informed

## 2021-09-09 LAB — BASIC METABOLIC PANEL
BUN: 7 (ref 4–21)
CO2: 31 — AB (ref 13–22)
Chloride: 103 (ref 99–108)
Creatinine: 0.6 (ref 0.5–1.1)
Glucose: 83
Potassium: 3.8 mEq/L (ref 3.5–5.1)
Sodium: 138 (ref 137–147)

## 2021-09-09 LAB — COMPREHENSIVE METABOLIC PANEL: Calcium: 9.4 (ref 8.7–10.7)

## 2021-09-09 LAB — CBC AND DIFFERENTIAL
HCT: 38 (ref 36–46)
Hemoglobin: 12.5 (ref 12.0–16.0)
Platelets: 260 10*3/uL (ref 150–400)
WBC: 7.1

## 2021-09-09 LAB — HEPATIC FUNCTION PANEL
ALT: 8 U/L (ref 7–35)
AST: 13 (ref 13–35)
Alkaline Phosphatase: 52 (ref 25–125)

## 2021-09-19 ENCOUNTER — Telehealth: Payer: Self-pay | Admitting: Gastroenterology

## 2021-09-19 NOTE — Telephone Encounter (Signed)
Got it No further testing yet She has appointment with me on 2/15.  Will discuss more RG

## 2021-09-19 NOTE — Telephone Encounter (Signed)
Message sent via my chart as follows:  "Hello Dr.Gupta !  Thanks for the appointment!  I had to rush to ER on 31st December for shortness of breath and was feeling dizzy.ER they checked X-ray EKG, blood work and discharged same day as all vitals are good , ER in [place].then again I went to primary care([facility])  as I was feeling shortness of breath , they did the chest X-ray and then the blood work, as per doctor all vitals are good. As part of blood work they found h pylori bacterial infection and prescribed two antibiotics ( Clarithromycin 500 MG- twice a day) and (Amoxicilin 500 MG- twice a day -two tablets at a time-1000 MG) for 14 days along with Pantoprazole and Pepcid (twice a day ) -morning and evening. They also gave me albuterol puff for shortness of breath but it is not effective on me..my primary doc is saying the shortness of breath is because of acid refluxI have started the antibiotics from 3 days but still I feel shortness of breath on and off. I wanted to put all these so If possible Doctor can look at all of my results on my chart and advise further.   Thank you , Kindly acknowledge this message."

## 2021-09-19 NOTE — Telephone Encounter (Signed)
Please see note below and advise  

## 2021-09-22 NOTE — Telephone Encounter (Signed)
Pt made aware of Dr. Gupta recommendations: Pt verbalized understanding with all questions answered.   

## 2021-09-23 NOTE — Telephone Encounter (Signed)
Patients spouse called requesting to speak with a nurse regarding the patient still having shortness of breath.

## 2021-09-23 NOTE — Telephone Encounter (Signed)
Pt states that she is having SOB everyday after eating and throughout the evening associated with burning sensation mid epigastric:   Pt states that she has been to the ER, Urgent Care, and her PCP recently. Pt states that the providers that she has seen are stating that it is acid reflux: Pt states that they recommend taking Pepcid twice a day. She has also been given an Albuterol Inhaler. Pt stated that she has recently had labs and x-rays which should be in her chart. Pt stated that she is currently being treated for the  H Pylori: Stated that she has been on antibiotics and that she will finish that treatment on 09/28/2021: Pt has previously been scheduled an office visit with Dr. Chales Abrahams on 10/01/2021. Pt aware. Pt requesting any recommendations for relief prior to Office Visit. Please advise

## 2021-09-24 ENCOUNTER — Other Ambulatory Visit: Payer: Self-pay

## 2021-09-24 DIAGNOSIS — K219 Gastro-esophageal reflux disease without esophagitis: Secondary | ICD-10-CM

## 2021-09-24 MED ORDER — SUCRALFATE 1 GM/10ML PO SUSP: 1.0000 g | Freq: Four times a day (QID) | ORAL | 0 refills | Status: DC

## 2021-09-24 NOTE — Telephone Encounter (Signed)
Pt sent another message via my chart as follows: "Hi Dr. Chales Abrahams,   I have been having shortness of breath from 31st December , after several visits to Urgent care and Primary care and taking antibiotics and pepcid and Pentaprozole for last 8 days, there is no improvement , I am still having shortness of breath and it makes me very uncomfortable. Can we meet you sooner or even talk to you as I am in lot of trouble due to this shortness of breath."  I have faxed it to [facility] for review.

## 2021-09-24 NOTE — Telephone Encounter (Signed)
She should get a primary care physician Can try Carafate elixir 1 g p.o. 4 times daily x 14 days  In addition to rest of medications RG

## 2021-09-24 NOTE — Telephone Encounter (Signed)
Pt made aware of Dr. Gupta recommendations: Prescription sent to pharmacy. Pt made aware. Pt verbalized understanding with all questions answered.   

## 2021-10-01 ENCOUNTER — Ambulatory Visit: Payer: 59 | Admitting: Gastroenterology

## 2021-10-01 ENCOUNTER — Encounter: Payer: Self-pay | Admitting: Gastroenterology

## 2021-10-01 ENCOUNTER — Other Ambulatory Visit: Payer: Self-pay

## 2021-10-01 VITALS — BP 102/78 | HR 73 | Ht 62.0 in | Wt 111.5 lb

## 2021-10-01 DIAGNOSIS — K219 Gastro-esophageal reflux disease without esophagitis: Secondary | ICD-10-CM | POA: Diagnosis not present

## 2021-10-01 DIAGNOSIS — Z8639 Personal history of other endocrine, nutritional and metabolic disease: Secondary | ICD-10-CM

## 2021-10-01 DIAGNOSIS — K581 Irritable bowel syndrome with constipation: Secondary | ICD-10-CM | POA: Diagnosis not present

## 2021-10-01 MED ORDER — OMEPRAZOLE 20 MG PO CPDR: 20.0000 mg | DELAYED_RELEASE_CAPSULE | Freq: Every day | ORAL | 5 refills | Status: DC

## 2021-10-01 MED ORDER — SUCRALFATE 1 GM/10ML PO SUSP: 1.0000 g | Freq: Four times a day (QID) | ORAL | 6 refills | Status: DC

## 2021-10-01 MED ORDER — DICYCLOMINE HCL 10 MG PO CAPS: 10.0000 mg | ORAL_CAPSULE | Freq: Four times a day (QID) | ORAL | 2 refills | Status: DC

## 2021-10-01 MED ORDER — FAMOTIDINE 20 MG PO TABS: 20.0000 mg | ORAL_TABLET | Freq: Every day | ORAL | 5 refills | Status: DC

## 2021-10-01 NOTE — Progress Notes (Signed)
Chief Complaint: Abdo pain  Referring Provider:  No ref. provider found      ASSESSMENT AND PLAN;   #1. GERD   #2. 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  + H. pylori antibody-treated   Plan: -Omeprazole 20mg  QD #90,  -pepcid 20mg  po QHS -Carafate elixir 1g PO QID x 2 weeks #6. -EGD in April 2023.  Earlier, if with problems. -Continue probiotics -Use bentyl 10mg  po QID PRN #120 2 refills. -If with any lower GI problems, would consider colonoscopy.  Otherwise routine screening colon at age 12. -Monitor weight. -FU in 6 months.    HPI:    Casey Pearson is a 41 y.o. 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  Has been having episodic shortness of breath/chest tightness Seen in the ED Negative EKG, chest x-ray Failed trial of albuterol Thought to be due to "severe reflux". Also had panic attacks Much better since she has been on Carafate elixir.  Has not been eating well.  Afraid to eat Has lost weight    From previous notes: 2 distinct episodes of LUQ pain crampy pain First episode 6 months ago lasting 10 days and then most recently last month when she went to urgent care center and was given a trial of Bentyl which did help a lot.  She has longstanding history of constipation " all her life" with pellet-like stools.   She has been under considerable stress lately since her dad passed away due to CVA at age 25 in [place]  Nl CBC (Hb 12.7), CMP, lipase, TSH 06/2020  Low Vit B12, given p.o. supplements.  Subsequent B12 was normal.  No definite history of lactose or gluten intolerance.  She does drink  plenty of water.  And lately has started walking as well.  Wt Readings from Last 3 Encounters:  10/01/21 111 lb 8 oz (50.6 kg)  08/16/21 116 lb (52.6 kg)  03/04/21 117 lb 2 oz (53.1 kg)     Past GI work-up:  NCCT (renal stone protocol) 09/12/2020 for hematuria -No acute intra-abdominal or intrapelvic pathology on a noncontrast  exam.  -No evidence of renal calculi or hydronephrosis.    Past Medical History:  Diagnosis Date   Medical history non-contributory     Past Surgical History:  Procedure Laterality Date   APPENDECTOMY     CESAREAN SECTION     DIAGNOSTIC LAPAROSCOPY WITH REMOVAL OF ECTOPIC PREGNANCY N/A 02/11/2019   Procedure: Laparoscopic LEFT Salpingo Oophorectomy AND REMOVAL OF ECTOPIC PREGNANCY;  Surgeon: Charyl Bigger, MD;  Location: [facility];  Service: Gynecology;  Laterality: N/A;    Family History  Problem Relation Age of Onset   Diabetes Mother    Hypertension Father    Pancreatic cancer Neg Hx    Colon cancer Neg Hx    Esophageal cancer Neg Hx    Liver disease Neg Hx    Stomach cancer Neg Hx     Social History   Tobacco Use   Smoking status: Never   Smokeless tobacco: Never  Vaping Use   Vaping Use: Never used  Substance Use Topics   Alcohol use: Never   Drug use: Never    Current Outpatient  Medications  Medication Sig Dispense Refill   cyanocobalamin 1000 MCG tablet Take 1 tablet by mouth daily.     dicyclomine (BENTYL) 10 MG capsule Take 1 capsule (10 mg total) by mouth QID. 120 capsule 3   famotidine (PEPCID) 20 MG tablet Take 1 tablet by mouth 2 (two) times daily.     omeprazole (PRILOSEC) 20 MG capsule Take 20 mg by mouth 2 (two) times daily.     ondansetron (ZOFRAN-ODT) 4 MG disintegrating tablet Take by mouth.     polyethylene glycol powder (GLYCOLAX/MIRALAX) 17 GM/SCOOP powder Take 17 g by mouth as needed.     sucralfate (CARAFATE) 1 GM/10ML suspension Take 10 mLs (1 g total) by mouth 4 (four) times daily for 14 days. 560 mL 0   No  current facility-administered medications for this visit.    No Known Allergies  Review of Systems:  Situational anxiety/panic attacks     Physical Exam:    BP 102/78    Pulse 73    Ht 5\' 2"  (1.575 m)    Wt 111 lb 8 oz (50.6 kg)    SpO2 99%    BMI 20.39 kg/m  Wt Readings from Last 3 Encounters:  10/01/21 111 lb 8 oz (50.6 kg)  08/16/21 116 lb (52.6 kg)  03/04/21 117 lb 2 oz (53.1 kg)   Constitutional:  Well-developed, in no acute distress. Psychiatric: Normal mood and affect. Behavior is normal. HEENT: Pupils normal.  Conjunctivae are normal. No scleral icterus. Cardiovascular: Normal rate, regular rhythm. No edema Pulmonary/chest: Effort normal and breath sounds normal. No wheezing, rales or rhonchi. Abdominal: Soft, nondistended. Nontender. Bowel sounds active throughout. There are no masses palpable. No hepatomegaly. Rectal: Deferred Neurological: Alert and oriented to person place and time. Skin: Skin is warm and dry. No rashes noted.  Data Reviewed: I have personally reviewed following labs and imaging studies  CBC: CBC Latest Ref Rng & Units 08/16/2021 03/04/2021 02/11/2019  WBC 4.0 - 10.5 K/uL 9.4 6.9 9.5  Hemoglobin 12.0 - 15.0 g/dL 12.0 12.2 10.7(L)  Hematocrit 36.0 - 46.0 % 35.5(L) 37.1 32.4(L)  Platelets 150 - 400 K/uL 235 239 276    CMP: CMP Latest Ref Rng & Units 08/16/2021 03/04/2021 02/11/2019  Glucose 70 - 99 mg/dL 113(H) 93 101(H)  BUN 6 - 20 mg/dL 13 5(L) 7  Creatinine 0.44 - 1.00 mg/dL 0.51 0.57 0.65  Sodium 135 - 145 mmol/L 132(L) 140 138  Potassium 3.5 - 5.1 mmol/L 3.9 3.7 3.8  Chloride 98 - 111 mmol/L 100 101 105  CO2 22 - 32 mmol/L 23 24 24   Calcium 8.9 - 10.3 mg/dL 8.7(L) 9.0 9.1  Total Protein 6.5 - 8.1 g/dL 7.1 6.4 6.7  Total Bilirubin 0.3 - 1.2 mg/dL 0.5 0.2 0.4  Alkaline Phos 38 - 126 U/L 43 54 40  AST 15 - 41 U/L 18 12 18   ALT 0 - 44 U/L 11 6 16    Reviewed chest x-ray independently and with patient     Carmell Austria, MD 10/01/2021, 11:37  AM  Cc: No ref. provider found

## 2021-10-01 NOTE — Patient Instructions (Addendum)
If you are age 40 or older, your body mass index should be between 23-30. Your Body mass index is 20.39 kg/m. If this is out of the aforementioned range listed, please consider follow up with your Primary Care Provider.  If you are age 54 or younger, your body mass index should be between 19-25. Your Body mass index is 20.39 kg/m. If this is out of the aformentioned range listed, please consider follow up with your Primary Care Provider.   __________________________________________________________  The [place] [facility] GI providers would like to encourage you to use [facility] to communicate with providers for non-urgent requests or questions.  Due to long hold times on the telephone, sending your provider a message by [facility] may be a faster and more efficient way to get a response.  Please allow 48 business hours for a response.  Please remember that this is for non-urgent requests.   Due to recent changes in healthcare laws, you may see the results of your imaging and laboratory studies on MyChart before your provider has had a chance to review them.  We understand that in some cases there may be results that are confusing or concerning to you. Not all laboratory results come back in the same time frame and the provider may be waiting for multiple results in order to interpret others.  Please give [place] 48 hours in order for your provider to thoroughly review all the results before contacting the office for clarification of your results.   Take Miralax 1/2 capful mixed in 8 ounces of water daily. Continue probiotics  Follow up in August 2023. Please call in July 2023 to schedule your appointment.  It was a pleasure to see you today!  Lynann Bologna, M.D.

## 2021-11-05 ENCOUNTER — Encounter: Payer: Self-pay | Admitting: Gastroenterology

## 2021-11-05 ENCOUNTER — Ambulatory Visit (AMBULATORY_SURGERY_CENTER): Payer: 59 | Admitting: Gastroenterology

## 2021-11-05 ENCOUNTER — Other Ambulatory Visit: Payer: Self-pay

## 2021-11-05 VITALS — BP 93/44 | HR 63 | Temp 97.8°F | Resp 14 | Ht 62.0 in | Wt 111.0 lb

## 2021-11-05 DIAGNOSIS — K2950 Unspecified chronic gastritis without bleeding: Secondary | ICD-10-CM | POA: Diagnosis not present

## 2021-11-05 DIAGNOSIS — R0789 Other chest pain: Secondary | ICD-10-CM | POA: Diagnosis not present

## 2021-11-05 DIAGNOSIS — K2970 Gastritis, unspecified, without bleeding: Secondary | ICD-10-CM

## 2021-11-05 DIAGNOSIS — K219 Gastro-esophageal reflux disease without esophagitis: Secondary | ICD-10-CM

## 2021-11-05 MED ORDER — SODIUM CHLORIDE 0.9 % IV SOLN: 500.0000 mL | Freq: Once | INTRAVENOUS | Status: DC

## 2021-11-05 NOTE — Progress Notes (Signed)
Called to room to assist during endoscopic procedure.  Patient ID and intended procedure confirmed with present staff. Received instructions for my participation in the procedure from the performing physician.  

## 2021-11-05 NOTE — Progress Notes (Signed)
To Pacu, VSS. Report to rn.tb ?

## 2021-11-05 NOTE — Progress Notes (Signed)
VS-DT 

## 2021-11-05 NOTE — Progress Notes (Signed)
? ? ?Chief Complaint: Abdo pain ? ?Referring Provider:  Lynann Bologna, MD    ? ? ?ASSESSMENT AND PLAN;  ? ?#1. GERD  ? ?#2. 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 Neg NCCT as below ? ?#3. IBS-C. Neg celiac 02/2021 ? ?#4.  + H. pylori antibody-treated ? ? ?Plan: ?-Omeprazole 20mg  QD #90,  ?-pepcid 20mg  po QHS ?-Carafate elixir 1g PO QID x 2 weeks #6. ?-EGD in April 2023.  Earlier, if with problems. ?-Continue probiotics ?-Use bentyl 10mg  po QID PRN #120 2 refills. ?-If with any lower GI problems, would consider colonoscopy.  Otherwise routine screening colon at age 58. ?-Monitor weight. ?-FU in 6 months. ? ? ? ?HPI:   ? ?Casey Pearson is a 41 y.o. female  ?Accompanied by her husband ? 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 ?30th dec - back spasms-given tramadol-had lightheadedness and "out of body experience" with hallucinations. ? ?Has been having episodic shortness of breath/chest tightness ?Seen in the ED ?Negative EKG, chest x-ray ?Failed trial of albuterol ?Thought to be due to "severe reflux". ?Also had panic attacks ?Much better since she has been on Carafate elixir. ? ?Has not been eating well.  Afraid to eat ?Has lost weight ? ? ? ?From previous notes: ?2 distinct episodes of LUQ pain crampy pain ?First episode 6 months ago lasting 10 days and then most recently last month when she went to urgent care center and was given a trial of Bentyl which did help a lot. ? ?She has longstanding history of constipation " all her life" with pellet-like stools.  ? ?She has been under considerable stress lately since her dad passed away due to CVA at age 7 in 10/01/2021 ? ?Nl CBC (Hb 12.7), CMP, lipase, TSH 06/2020 ? ?Low Vit B12, given p.o. supplements.  Subsequent B12 was normal. ? ?No definite history of lactose or gluten intolerance. ? ?She does drink plenty of  water.  And lately has started walking as well. ? ?Wt Readings from Last 3 Encounters:  ?11/05/21 111 lb (50.3 kg)  ?10/01/21 111 lb 8 oz (50.6 kg)  ?08/16/21 116 lb (52.6 kg)  ? ? ? ?Past GI work-up: ? ?NCCT (renal stone protocol) 09/12/2020 for hematuria ?-No acute intra-abdominal or intrapelvic pathology on a noncontrast  ?exam.  ?-No evidence of renal calculi or hydronephrosis.  ? ? ?Past Medical History:  ?Diagnosis Date  ? GERD (gastroesophageal reflux disease)   ? Medical history non-contributory   ? ? ?Past Surgical History:  ?Procedure Laterality Date  ? APPENDECTOMY    ? CESAREAN SECTION    ? DIAGNOSTIC LAPAROSCOPY WITH REMOVAL OF ECTOPIC PREGNANCY N/A 02/11/2019  ? Procedure: Laparoscopic LEFT Salpingo Oophorectomy AND REMOVAL OF ECTOPIC PREGNANCY;  Surgeon: 08/18/21, MD;  Location: [facility] OR;  Service: Gynecology;  Laterality: N/A;  ? UPPER GASTROINTESTINAL ENDOSCOPY    ? ? ?Family History  ?Problem Relation Age of Onset  ? Diabetes Mother   ? Hypertension Father   ? Pancreatic cancer Neg Hx   ? Colon cancer Neg Hx   ? Esophageal cancer Neg Hx   ? Liver disease Neg Hx   ? Stomach cancer Neg Hx   ? Rectal cancer Neg Hx   ? ? ?Social History  ? ?Tobacco Use  ? Smoking status: Never  ? Smokeless tobacco: Never  ?Vaping Use  ? Vaping Use: Never used  ?  Substance Use Topics  ? Alcohol use: Never  ? Drug use: Never  ? ? ?Current Outpatient Medications  ?Medication Sig Dispense Refill  ? cyanocobalamin 1000 MCG tablet Take 1 tablet by mouth daily.    ? famotidine (PEPCID) 20 MG tablet Take 1 tablet (20 mg total) by mouth at bedtime. 30 tablet 5  ? omeprazole (PRILOSEC) 20 MG capsule Take 1 capsule (20 mg total) by mouth daily. 30 capsule 5  ? polyethylene glycol powder (GLYCOLAX/MIRALAX) 17 GM/SCOOP powder Take 17 g by mouth as needed.    ? sucralfate (CARAFATE) 1 GM/10ML suspension Take 10 mLs (1 g total) by mouth 4 (four) times daily for 14 days. 560 mL 6  ? dicyclomine (BENTYL) 10 MG capsule Take 1  capsule (10 mg total) by mouth QID. 120 capsule 2  ? ondansetron (ZOFRAN-ODT) 4 MG disintegrating tablet Take by mouth.    ? ?Current Facility-Administered Medications  ?Medication Dose Route Frequency Provider Last Rate Last Admin  ? 0.9 %  sodium chloride infusion  500 mL Intravenous Once Lynann Bologna, MD      ? ? ?No Known Allergies ? ?Review of Systems:  ?Situational anxiety/panic attacks ? ?  ? ?Physical Exam:   ? ?BP 116/60   Pulse 72   Temp 97.8 ?F (36.6 ?C) (Temporal)   Ht 5\' 2"  (1.575 m)   Wt 111 lb (50.3 kg)   LMP 10/26/2021 (Exact Date)   SpO2 100%   BMI 20.30 kg/m?  ?Wt Readings from Last 3 Encounters:  ?11/05/21 111 lb (50.3 kg)  ?10/01/21 111 lb 8 oz (50.6 kg)  ?08/16/21 116 lb (52.6 kg)  ? ?Constitutional:  Well-developed, in no acute distress. ?Psychiatric: Normal mood and affect. Behavior is normal. ?HEENT: Pupils normal.  Conjunctivae are normal. No scleral icterus. ?Cardiovascular: Normal rate, regular rhythm. No edema ?Pulmonary/chest: Effort normal and breath sounds normal. No wheezing, rales or rhonchi. ?Abdominal: Soft, nondistended. Nontender. Bowel sounds active throughout. There are no masses palpable. No hepatomegaly. ?Rectal: Deferred ?Neurological: Alert and oriented to person place and time. ?Skin: Skin is warm and dry. No rashes noted. ? ?Data Reviewed: I have personally reviewed following labs and imaging studies ? ?CBC: ? ?  Latest Ref Rng & Units 08/16/2021  ?  9:51 PM 03/04/2021  ?  3:20 PM 02/11/2019  ?  3:08 PM  ?CBC  ?WBC 4.0 - 10.5 K/uL 9.4   6.9   9.5    ?Hemoglobin 12.0 - 15.0 g/dL 02/13/2019   28.7   86.7    ?Hematocrit 36.0 - 46.0 % 35.5   37.1   32.4    ?Platelets 150 - 400 K/uL 235   239   276    ? ? ?CMP: ? ?  Latest Ref Rng & Units 08/16/2021  ?  9:51 PM 03/04/2021  ?  3:20 PM 02/11/2019  ?  3:08 PM  ?CMP  ?Glucose 70 - 99 mg/dL 02/13/2019   93   094    ?BUN 6 - 20 mg/dL 13   5   7     ?Creatinine 0.44 - 1.00 mg/dL 709     6.28    ?Sodium 135 - 145 mmol/L 132   140   138     ?Potassium 3.5 - 5.1 mmol/L 3.9   3.7   3.8    ?Chloride 98 - 111 mmol/L 100   101   105    ?CO2 22 - 32 mmol/L 23   24   24     ?  Calcium 8.9 - 10.3 mg/dL 8.7   9.0   9.1    ?Total Protein 6.5 - 8.1 g/dL 7.1   6.4   6.7    ?Total Bilirubin 0.3 - 1.2 mg/dL 0.5   0.2   0.4    ?Alkaline Phos 38 - 126 U/L 43   54   40    ?AST 15 - 41 U/L 18   12   18     ?ALT 0 - 44 U/L 11   6   16     ? ?Reviewed chest x-ray independently and with patient ? ? ? ? ?Edman Circleaj Aishah Teffeteller, MD 11/05/2021, 8:35 AM ? ?Cc: Lynann BolognaGupta, Jessalyn Hinojosa, MD ? ? ?

## 2021-11-05 NOTE — Op Note (Signed)
[place] ?Patient Name: Casey Pearson ?Procedure Date: 11/05/2021 8:33 AM ?MRN: [id number] ?Endoscopist: Jackquline Denmark , MD ?Age: 41 ?Referring MD:  ?Date of Birth: October 02, 1980 ?Gender: Female ?Account #: [id number] ?Procedure:                Upper GI endoscopy ?Indications:              GERD with atypical chest pains/shortness of breath. ?Medicines:                Monitored Anesthesia Care ?Procedure:                Pre-Anesthesia Assessment: ?                          - Prior to the procedure, a History and Physical  ?                          was performed, and patient medications and  ?                          allergies were reviewed. The patient's tolerance of  ?                          previous anesthesia was also reviewed. The risks  ?                          and benefits of the procedure and the sedation  ?                          options and risks were discussed with the patient.  ?                          All questions were answered, and informed consent  ?                          was obtained. Prior Anticoagulants: The patient has  ?                          taken no previous anticoagulant or antiplatelet  ?                          agents. ASA Grade Assessment: II - A patient with  ?                          mild systemic disease. After reviewing the risks  ?                          and benefits, the patient was deemed in  ?                          satisfactory condition to undergo the procedure. ?                          After obtaining informed consent, the endoscope was  ?  passed under direct vision. Throughout the  ?                          procedure, the patient's blood pressure, pulse, and  ?                          oxygen saturations were monitored continuously. The  ?                          GIF [id number] was introduced through the  ?                          mouth, and advanced to the second part of duodenum.  ?                           The upper GI endoscopy was accomplished without  ?                          difficulty. The patient tolerated the procedure  ?                          well. ?Scope In: ?Scope Out: ?Findings:                 The examined esophagus was normal with well-defined  ?                          Z-line at 32 cm, examined by NBI. Biopsies were  ?                          obtained from the proximal and distal esophagus  ?                          with cold forceps for histology of suspected  ?                          eosinophilic esophagitis. ?                          The entire examined stomach was normal. Biopsies  ?                          were taken with a cold forceps for histology. ?                          The examined duodenum was normal. Biopsies for  ?                          histology were taken with a cold forceps for  ?                          evaluation of celiac disease. ?Complications:            No immediate complications. ?Estimated Blood Loss:     Estimated blood loss: none. ?Impression:               -  Normal EGD. ?Recommendation:           - Patient has a contact number available for  ?                          emergencies. The signs and symptoms of potential  ?                          delayed complications were discussed with the  ?                          patient. Return to normal activities tomorrow.  ?                          Written discharge instructions were provided to the  ?                          patient. ?                          - Resume previous diet. ?                          - Continue present medications. ?                          - Await pathology results. ?                          - The findings and recommendations were discussed  ?                          with the patient's family. ?Jackquline Denmark, MD ?11/05/2021 8:58:02 AM ?This report has been signed electronically. ?

## 2021-11-05 NOTE — Patient Instructions (Signed)
Await pathology results.   YOU HAD AN ENDOSCOPIC PROCEDURE TODAY AT THE [facility]:   Refer to the procedure report that was given to you for any specific questions about what was found during the examination.  If the procedure report does not answer your questions, please call your gastroenterologist to clarify.  If you requested that your care partner not be given the details of your procedure findings, then the procedure report has been included in a sealed envelope for you to review at your convenience later.  YOU SHOULD EXPECT: Some feelings of bloating in the abdomen. Passage of more gas than usual.  Walking can help get rid of the air that was put into your GI tract during the procedure and reduce the bloating. If you had a lower endoscopy (such as a colonoscopy or flexible sigmoidoscopy) you may notice spotting of blood in your stool or on the toilet paper. If you underwent a bowel prep for your procedure, you may not have a normal bowel movement for a few days.  Please Note:  You might notice some irritation and congestion in your nose or some drainage.  This is from the oxygen used during your procedure.  There is no need for concern and it should clear up in a day or so.  SYMPTOMS TO REPORT IMMEDIATELY:    Following upper endoscopy (EGD)  Vomiting of blood or coffee ground material  New chest pain or pain under the shoulder blades  Painful or persistently difficult swallowing  New shortness of breath  Fever of 100F or higher  Black, tarry-looking stools  For urgent or emergent issues, a gastroenterologist can be reached at any hour by calling [phone]. Do not use MyChart messaging for urgent concerns.    DIET:  We do recommend a small meal at first, but then you may proceed to your regular diet.  Drink plenty of fluids but you should avoid alcoholic beverages for 24 hours.  ACTIVITY:  You should plan to take it easy for the rest of today and you should NOT  DRIVE or use heavy machinery until tomorrow (because of the sedation medicines used during the test).    FOLLOW UP: Our staff will call the number listed on your records 48-72 hours following your procedure to check on you and address any questions or concerns that you may have regarding the information given to you following your procedure. If we do not reach you, we will leave a message.  We will attempt to reach you two times.  During this call, we will ask if you have developed any symptoms of COVID 19. If you develop any symptoms (ie: fever, flu-like symptoms, shortness of breath, cough etc.) before then, please call [phone].  If you test positive for Covid 19 in the 2 weeks post procedure, please call and report this information to us.    If any biopsies were taken you will be contacted by phone or by letter within the next 1-3 weeks.  Please call us at [phone] if you have not heard about the biopsies in 3 weeks.    SIGNATURES/CONFIDENTIALITY: You and/or your care partner have signed paperwork which will be entered into your electronic medical record.  These signatures attest to the fact that that the information above on your After Visit Summary has been reviewed and is understood.  Full responsibility of the confidentiality of this discharge information lies with you and/or your care-partner. 

## 2021-11-07 ENCOUNTER — Telehealth: Payer: Self-pay | Admitting: *Deleted

## 2021-11-07 NOTE — Telephone Encounter (Signed)
?  Follow up Call- ? ? ?  11/05/2021  ?  8:09 AM  ?Call back number  ?Post procedure Call Back phone  # [phone]  ?Permission to leave phone message Yes  ?  ? ?Patient questions: ? ?Do you have a fever, pain , or abdominal swelling? No. ?Pain Score  0 * ? ?Have you tolerated food without any problems? Yes.   ? ?Have you been able to return to your normal activities? Yes.   ? ?Do you have any questions about your discharge instructions: ?Diet   No. ?Medications  No. ?Follow up visit  No. ? ?Do you have questions or concerns about your Care? No. ? ?Actions: ?* If pain score is 4 or above: ?No action needed, pain <4. ? ? ?

## 2021-11-12 ENCOUNTER — Encounter: Payer: Self-pay | Admitting: Gastroenterology

## 2021-11-14 NOTE — Telephone Encounter (Signed)
Certainly, can add gluten ?If any problems, then would go back on gluten-free diet ?RG ?

## 2021-11-18 ENCOUNTER — Encounter: Payer: 59 | Admitting: Gastroenterology

## 2021-12-02 ENCOUNTER — Encounter: Payer: Self-pay | Admitting: Gastroenterology

## 2021-12-03 ENCOUNTER — Encounter: Payer: Self-pay | Admitting: Gastroenterology

## 2021-12-29 ENCOUNTER — Encounter: Payer: Self-pay | Admitting: Gastroenterology

## 2021-12-29 ENCOUNTER — Ambulatory Visit: Payer: 59 | Admitting: Gastroenterology

## 2021-12-29 VITALS — BP 100/64 | HR 75 | Ht 62.0 in | Wt 106.5 lb

## 2021-12-29 DIAGNOSIS — K219 Gastro-esophageal reflux disease without esophagitis: Secondary | ICD-10-CM | POA: Diagnosis not present

## 2021-12-29 MED ORDER — OMEPRAZOLE 20 MG PO CPDR: 20.0000 mg | DELAYED_RELEASE_CAPSULE | Freq: Every day | ORAL | 5 refills | Status: DC

## 2021-12-29 NOTE — Patient Instructions (Addendum)
If you are age 40 or older, your body mass index should be between 23-30. Your Body mass index is 19.48 kg/m?Marland Kitchen If this is out of the aforementioned range listed, please consider follow up with your Primary Care Provider. ? ?If you are age 80 or younger, your body mass index should be between 19-25. Your Body mass index is 19.48 kg/m?Marland Kitchen If this is out of the aformentioned range listed, please consider follow up with your Primary Care Provider.  ? ?________________________________________________________ ? ?The [facility] providers would like to encourage you to use [facility] to communicate with providers for non-urgent requests or questions.  Due to long hold times on the telephone, sending your provider a message by [facility] may be a faster and more efficient way to get a response.  Please allow 48 business hours for a response.  Please remember that this is for non-urgent requests.  ?_______________________________________________________ ? ?Omeprazole 20mg  QD ?Stop pepcid 20mg  po QHS ?Continue Carafate elixir prn. ?Continue mirlax/trifla. ? ?Please call in 1 year to schedule an office visit. ? ?Colonoscopy at age 54.  ? ?Thank you, ? ?Dr. ? ?

## 2021-12-29 NOTE — Progress Notes (Signed)
? ? ?Chief Complaint: Abdo pain ? ?Referring Provider:  No ref. provider found    ? ? ?ASSESSMENT AND PLAN;  ? ?#1. GERD  ? ?#2. 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 Neg NCCT as below ? ?#3. IBS-C. Neg celiac 02/2021 ? ?#4.  + H. pylori antibody-treated ? ? ?Plan: ?-Omeprazole 20mg  QD until she returns from [place] trip, then can wean off to every other day. ?-Stop pepcid 20mg  po QHS ?-Continue Carafate elixir prn. ?-Continue mirlax/trifla. ?-If with any lower GI problems, would consider colonoscopy.  Otherwise routine screening colon at age 41. ?-FU in 1 year. ? ? ? ?HPI:   ? ?Casey Pearson is a 41 y.o. female  ?Accompanied by her husband ? ?For FU ? ?Much better. ? ?Doing very well. ? ?No nausea, vomiting, heartburn, regurgitation, odynophagia or dysphagia.  No significant diarrhea or constipation.  No melena or hematochezia. No unintentional weight loss. No abdominal pain. ? ?Has not used Carafate since upper endoscopy.  She continues to be on omeprazole 20 mg p.o. every morning and Pepcid at night. ? ?She has also been using MiraLAX and Triphala (fiber) QD ? ?Planning to go to [place] in July. ? ? ? ?From previous notes: ?2 distinct episodes of LUQ pain crampy pain ?First episode 6 months ago lasting 10 days and then most recently last month when she went to urgent care center and was given a trial of Bentyl which did help a lot. ? ?She has longstanding history of constipation " all her life" with pellet-like stools.  ? ?She has been under considerable stress lately since her dad passed away due to CVA at age 40 in [place] ? ?Nl CBC (Hb 12.7), CMP, lipase, TSH 06/2020 ? ?Low Vit B12, given p.o. supplements.  Subsequent B12 was normal. ? ?No definite history of lactose or gluten intolerance. ? ?She does drink plenty of water.  And lately has started walking as well. ? ?Wt Readings from Last 3 Encounters:  ?12/29/21 106 lb 8 oz (48.3 kg)  ?11/05/21 111 lb (50.3 kg)  ?10/01/21 111 lb 8 oz (50.6 kg)   ? ? ? ?Past GI work-up: ? ?NCCT (renal stone protocol) 09/12/2020 for hematuria ?-No acute intra-abdominal or intrapelvic pathology on a noncontrast  ?exam.  ?-No evidence of renal calculi or hydronephrosis.  ? ?EGD 10/2021: Normal EGD ?Negative biopsies for HP, negative small bowel biopsies for celiac. ? ? ?Past Medical History:  ?Diagnosis Date  ? GERD (gastroesophageal reflux disease)   ? Medical history non-contributory   ? ? ?Past Surgical History:  ?Procedure Laterality Date  ? APPENDECTOMY    ? CESAREAN SECTION    ? DIAGNOSTIC LAPAROSCOPY WITH REMOVAL OF ECTOPIC PREGNANCY N/A 02/11/2019  ? Procedure: Laparoscopic LEFT Salpingo Oophorectomy AND REMOVAL OF ECTOPIC PREGNANCY;  Surgeon: Vick FreesAlmquist, Susan E, MD;  [facility];  Service: Gynecology;  Laterality: N/A;  ? UPPER GASTROINTESTINAL ENDOSCOPY    ? ? ?Family History  ?Problem Relation Age of Onset  ? Diabetes Mother   ? Hypertension Father   ? Pancreatic cancer Neg Hx   ? Colon cancer Neg Hx   ? Esophageal cancer Neg Hx   ? Liver disease Neg Hx   ? Stomach cancer Neg Hx   ? Rectal cancer Neg Hx   ? ? ?Social History  ? ?Tobacco Use  ? Smoking status: Never  ? Smokeless tobacco: Never  ?Vaping Use  ? Vaping Use: Never used  ?Substance Use Topics  ? Alcohol use: Never  ?  Drug use: Never  ? ? ?Current Outpatient Medications  ?Medication Sig Dispense Refill  ? cyanocobalamin 1000 MCG tablet Take 1 tablet by mouth daily as needed.    ? dicyclomine (BENTYL) 10 MG capsule Take 1 capsule (10 mg total) by mouth QID. 120 capsule 2  ? famotidine (PEPCID) 20 MG tablet Take 1 tablet (20 mg total) by mouth at bedtime. 30 tablet 5  ? omeprazole (PRILOSEC) 20 MG capsule Take 1 capsule (20 mg total) by mouth daily. 30 capsule 5  ? polyethylene glycol powder (GLYCOLAX/MIRALAX) 17 GM/SCOOP powder Take 17 g by mouth as needed.    ? sucralfate (CARAFATE) 1 GM/10ML suspension Take 10 mLs (1 g total) by mouth 4 (four) times daily for 14 days. (Patient taking differently: Take 1  g by mouth daily as needed.) 560 mL 6  ? ?No current facility-administered medications for this visit.  ? ? ?No Known Allergies ? ?Review of Systems:  ?Situational anxiety/panic attacks ? ?  ? ?Physical Exam:   ? ?BP 100/64   Pulse 75   Ht 5\' 2"  (1.575 m)   Wt 106 lb 8 oz (48.3 kg)   SpO2 100%   BMI 19.48 kg/m?  ?Wt Readings from Last 3 Encounters:  ?12/29/21 106 lb 8 oz (48.3 kg)  ?11/05/21 111 lb (50.3 kg)  ?10/01/21 111 lb 8 oz (50.6 kg)  ? ?Constitutional:  Well-developed, in no acute distress. ?Psychiatric: Normal mood and affect. Behavior is normal. ?HEENT: Pupils normal.  Conjunctivae are normal. No scleral icterus. ?Abdominal: Soft, nondistended. Nontender. Bowel sounds active throughout. There are no masses palpable. No hepatomegaly. ?Neurological: Alert and oriented to person place and time. ?Skin: Skin is warm and dry. No rashes noted. ? ?Data Reviewed: I have personally reviewed following labs and imaging studies ? ?CBC: ? ?  Latest Ref Rng & Units 08/16/2021  ?  9:51 PM 03/04/2021  ?  3:20 PM 02/11/2019  ?  3:08 PM  ?CBC  ?WBC 4.0 - 10.5 K/uL 9.4   6.9   9.5    ?Hemoglobin 12.0 - 15.0 g/dL 02/13/2019   09.6   28.3    ?Hematocrit 36.0 - 46.0 % 35.5   37.1   32.4    ?Platelets 150 - 400 K/uL 235   239   276    ? ? ?CMP: ? ?  Latest Ref Rng & Units 08/16/2021  ?  9:51 PM 03/04/2021  ?  3:20 PM 02/11/2019  ?  3:08 PM  ?CMP  ?Glucose 70 - 99 mg/dL 02/13/2019   93   947    ?BUN 6 - 20 mg/dL 13   5   7     ?Creatinine 0.44 - 1.00 mg/dL 654     6.50    ?Sodium 135 - 145 mmol/L 132   140   138    ?Potassium 3.5 - 5.1 mmol/L 3.9   3.7   3.8    ?Chloride 98 - 111 mmol/L 100   101   105    ?CO2 22 - 32 mmol/L 23   24   24     ?Calcium 8.9 - 10.3 mg/dL 8.7   9.0   9.1    ?Total Protein 6.5 - 8.1 g/dL 7.1   6.4   6.7    ?Total Bilirubin 0.3 - 1.2 mg/dL 0.5   0.2   0.4    ?Alkaline Phos 38 - 126 U/L 43   54   40    ?AST 15 -  41 U/L 18   12   18     ?ALT 0 - 44 U/L 11   6   16     ? ?Reviewed chest x-ray independently and with  patient ? ? ? ? ? , MD 12/29/2021, 11:03 AM ? ?Cc: No ref. provider found ? ? ?

## 2022-01-14 ENCOUNTER — Ambulatory Visit: Payer: 59 | Admitting: Family Medicine

## 2022-01-17 ENCOUNTER — Ambulatory Visit
Admission: EM | Admit: 2022-01-17 | Discharge: 2022-01-17 | Disposition: A | Discharge status: Home / Self Care | Payer: 59 | Attending: Emergency Medicine | Admitting: Emergency Medicine

## 2022-01-17 DIAGNOSIS — S46812A Strain of other muscles, fascia and tendons at shoulder and upper arm level, left arm, initial encounter: Secondary | ICD-10-CM

## 2022-01-17 MED ORDER — BACLOFEN 10 MG PO TABS: 10.0000 mg | ORAL_TABLET | Freq: Three times a day (TID) | ORAL | 0 refills | Status: AC

## 2022-01-17 MED ORDER — DICLOFENAC SODIUM 1 % EX GEL: 4.0000 g | Freq: Four times a day (QID) | CUTANEOUS | 2 refills | Status: DC

## 2022-01-17 MED ORDER — ACETAMINOPHEN 500 MG PO TABS: 1000.0000 mg | ORAL_TABLET | Freq: Three times a day (TID) | ORAL | 0 refills | Status: AC

## 2022-01-17 NOTE — ED Provider Notes (Signed)
[facility]    CSN: [id number] Arrival date & time: 01/17/22  1332    HISTORY   Chief Complaint  Patient presents with   Back Pain   HPI Casey Pearson is a 41 y.o. female. Patient complains of a 2 to 3-day history of upper back pain.  Patient states she has been taking Tylenol 500 mg 4 times daily which helps but the pain returns.  Patient reports a history of muscle spasm on the left left side of her neck in the past, has been prescribed muscle relaxers which she finds helpful.  Patient states she avoids NSAIDs because she has GERD.  The history is provided by the patient.  Past Medical History:  Diagnosis Date   GERD (gastroesophageal reflux disease)    Medical history non-contributory    Patient Active Problem List   Diagnosis Date Noted   Ectopic pregnancy 02/02/2019   Past Surgical History:  Procedure Laterality Date   APPENDECTOMY     CESAREAN SECTION     DIAGNOSTIC LAPAROSCOPY WITH REMOVAL OF ECTOPIC PREGNANCY N/A 02/11/2019   Procedure: Laparoscopic LEFT Salpingo Oophorectomy AND REMOVAL OF ECTOPIC PREGNANCY;  Surgeon: Vick Frees, MD;  Location: [facility] OR;  Service: Gynecology;  Laterality: N/A;   UPPER GASTROINTESTINAL ENDOSCOPY     OB History     Gravida  2   Para  1   Term  1   Preterm      AB      Living  1      SAB      IAB      Ectopic      Multiple      Live Births  1        Obstetric Comments  Failed to dilate, del in [place]        Home Medications    Prior to Admission medications   Medication Sig Start Date End Date Taking? Authorizing Provider  cyanocobalamin 1000 MCG tablet Take 1 tablet by mouth daily as needed.    [provider]  dicyclomine (BENTYL) 10 MG capsule Take 1 capsule (10 mg total) by mouth QID. 10/01/21   Lynann Bologna, MD  famotidine (PEPCID) 20 MG tablet Take 1 tablet (20 mg total) by mouth at bedtime. 10/01/21   Lynann Bologna, MD  omeprazole (PRILOSEC) 20 MG capsule Take 1 capsule  (20 mg total) by mouth daily. 12/29/21   Lynann Bologna, MD  polyethylene glycol powder (GLYCOLAX/MIRALAX) 17 GM/SCOOP powder Take 17 g by mouth as needed. 10/08/20   [provider]  sucralfate (CARAFATE) 1 GM/10ML suspension Take 10 mLs (1 g total) by mouth 4 (four) times daily for 14 days. Patient taking differently: Take 1 g by mouth daily as needed. 10/01/21 11/05/21  Lynann Bologna, MD    Family History Family History  Problem Relation Age of Onset   Diabetes Mother    Hypertension Father    Pancreatic cancer Neg Hx    Colon cancer Neg Hx    Esophageal cancer Neg Hx    Liver disease Neg Hx    Stomach cancer Neg Hx    Rectal cancer Neg Hx    Social History Social History   Tobacco Use   Smoking status: Never   Smokeless tobacco: Never  Vaping Use   Vaping Use: Never used  Substance Use Topics   Alcohol use: Never   Drug use: Never   Allergies   Tramadol  Review of Systems Review of Systems Pertinent findings  noted in history of present illness.   Physical Exam Triage Vital Signs ED Triage Vitals  Enc Vitals Group     BP 06/13/21 0827 (!) 147/82     Pulse Rate 06/13/21 0827 72     Resp 06/13/21 0827 18     Temp 06/13/21 0827 98.3 F (36.8 C)     Temp Source 06/13/21 0827 Oral     SpO2 06/13/21 0827 98 %     Weight --      Height --      Head Circumference --      Peak Flow --      Pain Score 06/13/21 0826 5     Pain Loc --      Pain Edu? --      Excl. in GC? --   No data found.  Updated Vital Signs BP 116/73 (BP Location: Right Arm)   Pulse 69   Temp 97.9 F (36.6 C) (Oral)   Resp 18   LMP 12/27/2021   SpO2 99%   Physical Exam Vitals and nursing note reviewed.  Constitutional:      General: She is not in acute distress.    Appearance: Normal appearance. She is not ill-appearing.  HENT:     Head: Normocephalic and atraumatic.     Salivary Glands: Right salivary gland is not diffusely enlarged or tender. Left salivary gland is not  diffusely enlarged or tender.     Right Ear: Tympanic membrane, ear canal and external ear normal. No drainage. No middle ear effusion. There is no impacted cerumen. Tympanic membrane is not erythematous or bulging.     Left Ear: Tympanic membrane, ear canal and external ear normal. No drainage.  No middle ear effusion. There is no impacted cerumen. Tympanic membrane is not erythematous or bulging.     Nose: Nose normal. No nasal deformity, septal deviation, mucosal edema, congestion or rhinorrhea.     Right Turbinates: Not enlarged, swollen or pale.     Left Turbinates: Not enlarged, swollen or pale.     Right Sinus: No maxillary sinus tenderness or frontal sinus tenderness.     Left Sinus: No maxillary sinus tenderness or frontal sinus tenderness.     Mouth/Throat:     Lips: Pink. No lesions.     Mouth: Mucous membranes are moist. No oral lesions.     Pharynx: Oropharynx is clear. Uvula midline. No posterior oropharyngeal erythema or uvula swelling.     Tonsils: No tonsillar exudate. 0 on the right. 0 on the left.  Eyes:     General: Lids are normal.        Right eye: No discharge.        Left eye: No discharge.     Extraocular Movements: Extraocular movements intact.     Conjunctiva/sclera: Conjunctivae normal.     Right eye: Right conjunctiva is not injected.     Left eye: Left conjunctiva is not injected.  Neck:     Trachea: Trachea and phonation normal.  Cardiovascular:     Rate and Rhythm: Normal rate and regular rhythm.     Pulses: Normal pulses.     Heart sounds: Normal heart sounds. No murmur heard.   No friction rub. No gallop.  Pulmonary:     Effort: Pulmonary effort is normal. No accessory muscle usage, prolonged expiration or respiratory distress.     Breath sounds: Normal breath sounds. No stridor, decreased air movement or transmitted upper airway sounds. No decreased breath sounds, wheezing,  rhonchi or rales.  Chest:     Chest wall: No tenderness.  Musculoskeletal:         General: No swelling, tenderness, deformity or signs of injury. Normal range of motion.     Cervical back: Normal range of motion and neck supple. Normal range of motion.     Right lower leg: No edema.     Left lower leg: No edema.  Lymphadenopathy:     Cervical: No cervical adenopathy.  Skin:    General: Skin is warm and dry.     Findings: No erythema or rash.  Neurological:     General: No focal deficit present.     Mental Status: She is alert and oriented to person, place, and time.  Psychiatric:        Mood and Affect: Mood normal.        Behavior: Behavior normal.    Visual Acuity Right Eye Distance:   Left Eye Distance:   Bilateral Distance:    Right Eye Near:   Left Eye Near:    Bilateral Near:     [facility] Couse / Diagnostics / Procedures:    EKG  Radiology No results found.  Procedures Procedures (including critical care time)  [facility] Diagnoses / Final Clinical Impressions(s)   I have reviewed the triage vital signs and the nursing notes.  Pertinent labs & imaging results that were available during my care of the patient were reviewed by me and considered in my medical decision making (see chart for details).    Final diagnoses:  Trapezius muscle strain, left, initial encounter   Patient was advised to: Begin acetaminophen 1000 mg 3 times daily on a scheduled basis.   Apply ice pack to affected area 4 times daily for 20 minutes each time   Apply topical Voltaren gel 4 times daily as needed   Avoid stretching or strengthening exercises until pain is completely resolved   Patient education handout provided for self-care at home.   Consider physical therapy, chiropractic care, orthopedic follow-up   Return precautions advised   ED Prescriptions     Medication Sig Dispense Auth. Provider   acetaminophen (TYLENOL) 500 MG tablet Take 2 tablets (1,000 mg total) by mouth every 8 (eight) hours. 180 tablet Theadora Rama Scales, PA-C   diclofenac Sodium  (VOLTAREN) 1 % GEL Apply 4 g topically 4 (four) times daily. Apply to affected areas 4 times daily as needed for pain. 100 g Theadora Rama Scales, PA-C   baclofen (LIORESAL) 10 MG tablet Take 1 tablet (10 mg total) by mouth 3 (three) times daily for 7 days. 21 tablet Theadora Rama Scales, PA-C      PDMP not reviewed this encounter.  Pending results:  Labs Reviewed - No data to display  Medications Ordered in UC: Medications - No data to display  Disposition Upon Discharge:  Condition: stable for discharge home Home: take medications as prescribed; routine discharge instructions as discussed; follow up as advised.  Patient presented with an acute illness with associated systemic symptoms and significant discomfort requiring urgent management. In my opinion, this is a condition that a prudent lay person (someone who possesses an average knowledge of health and medicine) may potentially expect to result in complications if not addressed urgently such as respiratory distress, impairment of bodily function or dysfunction of bodily organs.   Routine symptom specific, illness specific and/or disease specific instructions were discussed with the patient and/or caregiver at length.   As such, the patient has been  evaluated and assessed, work-up was performed and treatment was provided in alignment with urgent care protocols and evidence based medicine.  Patient/parent/caregiver has been advised that the patient may require follow up for further testing and treatment if the symptoms continue in spite of treatment, as clinically indicated and appropriate.  If the patient was tested for COVID-19, Influenza and/or RSV, then the patient/parent/guardian was advised to isolate at home pending the results of his/her diagnostic coronavirus test and potentially longer if they're positive. I have also advised pt that if his/her COVID-19 test returns positive, it's recommended to self-isolate for at least 10  days after symptoms first appeared AND until fever-free for 24 hours without fever reducer AND other symptoms have improved or resolved. Discussed self-isolation recommendations as well as instructions for household member/close contacts as per the [facility] and [place] [facility], and also gave patient the COVID packet with this information.  Patient/parent/caregiver has been advised to [facility]CP in 3-5 days if no better; to PCP or the Emergency Department if new signs and symptoms develop, or if the current signs or symptoms continue to change or worsen for further workup, evaluation and treatment as clinically indicated and appropriate  The patient will follow up with their current PCP if and as advised. If the patient does not currently have a PCP we will assist them in obtaining one.   The patient may need specialty follow up if the symptoms continue, in spite of conservative treatment and management, for further workup, evaluation, consultation and treatment as clinically indicated and appropriate.   Patient/parent/caregiver verbalized understanding and agreement of plan as discussed.  All questions were addressed during visit.  Please see discharge instructions below for further details of plan.  Discharge Instructions:   Discharge Instructions      The mainstay of therapy for musculoskeletal pain is reduction of inflammation and relaxation of tension which is causing inflammation.  Keep in mind, pain always begets more pain.  To help you stay ahead of your pain and inflammation, I have provided the following regimen for you:   Please begin taking Tylenol 1000 mg 3 times daily (every 8 hours) as soon as you pick up your prescriptions from the pharmacy.   This evening, you can begin taking baclofen 10 mg.  This is a highly effective muscle relaxer and antispasmodic which should continue to provide you with relaxation of your tense muscles, allow you to sleep well and to keep your pain under  control.  You can continue taking this medication 3 times daily as you need to.  If you find that this medication makes you too sleepy, you can break them in half for your daytime doses and, if needed double them for your nighttime dose.  Do not take more than 30 mg of baclofen in a 24-hour period.   During the day, please set aside time to apply ice to the affected area 4 times daily for 20 minutes each application.  This can be achieved by using a bag of frozen peas or corn, a Ziploc bag filled with ice and water, or Ziploc bag filled with half rubbing alcohol and half Dawn dish detergent, frozen into a slush.  Please be careful not to apply ice directly to your skin, always place a soft cloth between you and the ice pack.   You are welcome to use topical anti-inflammatory creams such as Voltaren gel, capsaicin or Aspercreme as recommended.  These medications are available over-the-counter, please follow manufactures  instructions for use.  As a courtesy, I provided you with a prescription for diclofenac in the event that your insurance will pay for this.   Please consider discussing referral to physical therapy with your primary care provider.  Physical therapist are very good at teasing out the underlying cause of acute lower back pain and helping with prevention of future recurrences.   Please avoid attempts to stretch or strengthen the affected area until you are feeling completely pain-free.  Attempts to do so will only prolong the healing process.   Thank you for visiting urgent care today.  We appreciate the opportunity to participate in your care.       This office note has been dictated using Teaching laboratory technician.  Unfortunately, and despite my best efforts, this method of dictation can sometimes lead to occasional typographical or grammatical errors.  I apologize in advance if this occurs.     Theadora Rama Scales, PA-C 01/17/22 [phone]

## 2022-01-17 NOTE — Discharge Instructions (Signed)
The mainstay of therapy for musculoskeletal pain is reduction of inflammation and relaxation of tension which is causing inflammation.  Keep in mind, pain always begets more pain.  To help you stay ahead of your pain and inflammation, I have provided the following regimen for you:   Please begin taking Tylenol 1000 mg 3 times daily (every 8 hours) as soon as you pick up your prescriptions from the pharmacy.   This evening, you can begin taking baclofen 10 mg.  This is a highly effective muscle relaxer and antispasmodic which should continue to provide you with relaxation of your tense muscles, allow you to sleep well and to keep your pain under control.  You can continue taking this medication 3 times daily as you need to.  If you find that this medication makes you too sleepy, you can break them in half for your daytime doses and, if needed double them for your nighttime dose.  Do not take more than 30 mg of baclofen in a 24-hour period.   During the day, please set aside time to apply ice to the affected area 4 times daily for 20 minutes each application.  This can be achieved by using a bag of frozen peas or corn, a Ziploc bag filled with ice and water, or Ziploc bag filled with half rubbing alcohol and half Dawn dish detergent, frozen into a slush.  Please be careful not to apply ice directly to your skin, always place a soft cloth between you and the ice pack.   You are welcome to use topical anti-inflammatory creams such as Voltaren gel, capsaicin or Aspercreme as recommended.  These medications are available over-the-counter, please follow manufactures instructions for use.  As a courtesy, I provided you with a prescription for diclofenac in the event that your insurance will pay for this.   Please consider discussing referral to physical therapy with your primary care provider.  Physical therapist are very good at teasing out the underlying cause of acute lower back pain and helping with prevention  of future recurrences.   Please avoid attempts to stretch or strengthen the affected area until you are feeling completely pain-free.  Attempts to do so will only prolong the healing process.   Thank you for visiting urgent care today.  We appreciate the opportunity to participate in your care.

## 2022-01-17 NOTE — ED Triage Notes (Signed)
Pt states for 2-3 day she has been having upper back pain. Home interventions: tylenol- helpful

## 2022-02-09 ENCOUNTER — Ambulatory Visit: Payer: 59 | Admitting: Emergency Medicine

## 2022-02-16 ENCOUNTER — Encounter: Payer: Self-pay | Admitting: Gastroenterology

## 2022-02-24 NOTE — Telephone Encounter (Signed)
Going out of the country to [place] and is requesting her refills to be sent to her pharmacy as soon as possible please.

## 2022-02-25 ENCOUNTER — Other Ambulatory Visit: Payer: Self-pay

## 2022-02-25 MED ORDER — DICYCLOMINE HCL 10 MG PO CAPS: 10.0000 mg | ORAL_CAPSULE | Freq: Four times a day (QID) | ORAL | 2 refills | Status: AC

## 2022-02-25 MED ORDER — OMEPRAZOLE 20 MG PO CPDR: 20.0000 mg | DELAYED_RELEASE_CAPSULE | Freq: Every day | ORAL | 3 refills | Status: DC

## 2022-02-25 NOTE — Telephone Encounter (Signed)
Please call in omeprazole 20 mg p.o. once a day #90, 3 RF Bentyl 10 mg p.o. BID prn #60, 3RF  She should continue taking omeprazole until she returns from [place] and then can take it every other day x 2 weeks and then stop  RG

## 2022-05-06 ENCOUNTER — Ambulatory Visit: Payer: 59 | Admitting: Emergency Medicine

## 2022-05-18 ENCOUNTER — Encounter: Payer: Self-pay | Admitting: Internal Medicine

## 2022-05-18 ENCOUNTER — Ambulatory Visit: Payer: 59 | Admitting: Internal Medicine

## 2022-05-18 VITALS — BP 126/70 | HR 69 | Temp 98.0°F | Ht 62.0 in | Wt 107.0 lb

## 2022-05-18 DIAGNOSIS — Z124 Encounter for screening for malignant neoplasm of cervix: Secondary | ICD-10-CM

## 2022-05-18 DIAGNOSIS — K219 Gastro-esophageal reflux disease without esophagitis: Secondary | ICD-10-CM | POA: Diagnosis not present

## 2022-05-18 NOTE — Patient Instructions (Signed)
Gastroesophageal Reflux Disease, Adult Gastroesophageal reflux (GER) happens when acid from the stomach flows up into the tube that connects the mouth and the stomach (esophagus). Normally, food travels down the esophagus and stays in the stomach to be digested. However, when a person has GER, food and stomach acid sometimes move back up into the esophagus. If this becomes a more serious problem, the person may be diagnosed with a disease called gastroesophageal reflux disease (GERD). GERD occurs when the reflux: Happens often. Causes frequent or severe symptoms. Causes problems such as damage to the esophagus. When stomach acid comes in contact with the esophagus, the acid may cause inflammation in the esophagus. Over time, GERD may create small holes (ulcers) in the lining of the esophagus. What are the causes? This condition is caused by a problem with the muscle between the esophagus and the stomach (lower esophageal sphincter, or LES). Normally, the LES muscle closes after food passes through the esophagus to the stomach. When the LES is weakened or abnormal, it does not close properly, and that allows food and stomach acid to go back up into the esophagus. The LES can be weakened by certain dietary substances, medicines, and medical conditions, including: Tobacco use. Pregnancy. Having a hiatal hernia. Alcohol use. Certain foods and beverages, such as coffee, chocolate, onions, and peppermint. What increases the risk? You are more likely to develop this condition if you: Have an increased body weight. Have a connective tissue disorder. Take NSAIDs, such as ibuprofen. What are the signs or symptoms? Symptoms of this condition include: Heartburn. Difficult or painful swallowing and the feeling of having a lump in the throat. A bitter taste in the mouth. Bad breath and having a large amount of saliva. Having an upset or bloated stomach and belching. Chest pain. Different conditions can  cause chest pain. Make sure you see your health care provider if you experience chest pain. Shortness of breath or wheezing. Ongoing (chronic) cough or a nighttime cough. Wearing away of tooth enamel. Weight loss. How is this diagnosed? This condition may be diagnosed based on a medical history and a physical exam. To determine if you have mild or severe GERD, your health care provider may also monitor how you respond to treatment. You may also have tests, including: A test to examine your stomach and esophagus with a small camera (endoscopy). A test that measures the acidity level in your esophagus. A test that measures how much pressure is on your esophagus. A barium swallow or modified barium swallow test to show the shape, size, and functioning of your esophagus. How is this treated? Treatment for this condition may vary depending on how severe your symptoms are. Your health care provider may recommend: Changes to your diet. Medicine. Surgery. The goal of treatment is to help relieve your symptoms and to prevent complications. Follow these instructions at home: Eating and drinking  Follow a diet as recommended by your health care provider. This may involve avoiding foods and drinks such as: Coffee and tea, with or without caffeine. Drinks that contain alcohol. Energy drinks and sports drinks. Carbonated drinks or sodas. Chocolate and cocoa. Peppermint and mint flavorings. Garlic and onions. Horseradish. Spicy and acidic foods, including peppers, chili powder, curry powder, vinegar, hot sauces, and barbecue sauce. Citrus fruit juices and citrus fruits, such as oranges, lemons, and limes. Tomato-based foods, such as red sauce, chili, salsa, and pizza with red sauce. Fried and fatty foods, such as donuts, french fries, potato chips, and high-fat dressings.   High-fat meats, such as hot dogs and fatty cuts of red and white meats, such as rib eye steak, sausage, ham, and  bacon. High-fat dairy items, such as whole milk, butter, and cream cheese. Eat small, frequent meals instead of large meals. Avoid drinking large amounts of liquid with your meals. Avoid eating meals during the 2-3 hours before bedtime. Avoid lying down right after you eat. Do not exercise right after you eat. Lifestyle  Do not use any products that contain nicotine or tobacco. These products include cigarettes, chewing tobacco, and vaping devices, such as e-cigarettes. If you need help quitting, ask your health care provider. Try to reduce your stress by using methods such as yoga or meditation. If you need help reducing stress, ask your health care provider. If you are overweight, reduce your weight to an amount that is healthy for you. Ask your health care provider for guidance about a safe weight loss goal. General instructions Pay attention to any changes in your symptoms. Take over-the-counter and prescription medicines only as told by your health care provider. Do not take aspirin, ibuprofen, or other NSAIDs unless your health care provider told you to take these medicines. Wear loose-fitting clothing. Do not wear anything tight around your waist that causes pressure on your abdomen. Raise (elevate) the head of your bed about 6 inches (15 cm). You can use a wedge to do this. Avoid bending over if this makes your symptoms worse. Keep all follow-up visits. This is important. Contact a health care provider if: You have: New symptoms. Unexplained weight loss. Difficulty swallowing or it hurts to swallow. Wheezing or a persistent cough. A hoarse voice. Your symptoms do not improve with treatment. Get help right away if: You have sudden pain in your arms, neck, jaw, teeth, or back. You suddenly feel sweaty, dizzy, or light-headed. You have chest pain or shortness of breath. You vomit and the vomit is green, yellow, or black, or it looks like blood or coffee grounds. You faint. You  have stool that is red, bloody, or black. You cannot swallow, drink, or eat. These symptoms may represent a serious problem that is an emergency. Do not wait to see if the symptoms will go away. Get medical help right away. Call your local emergency services (911 in the [place] Do not drive yourself to the hospital. Summary Gastroesophageal reflux happens when acid from the stomach flows up into the esophagus. GERD is a disease in which the reflux happens often, causes frequent or severe symptoms, or causes problems such as damage to the esophagus. Treatment for this condition may vary depending on how severe your symptoms are. Your health care provider may recommend diet and lifestyle changes, medicine, or surgery. Contact a health care provider if you have new or worsening symptoms. Take over-the-counter and prescription medicines only as told by your health care provider. Do not take aspirin, ibuprofen, or other NSAIDs unless your health care provider told you to do so. Keep all follow-up visits as told by your health care provider. This is important. This information is not intended to replace advice given to you by your health care provider. Make sure you discuss any questions you have with your health care provider. Document Revised: 02/12/2020 Document Reviewed: 02/12/2020 [employer] Patient Education  2023 [employer]  

## 2022-05-18 NOTE — Progress Notes (Signed)
Subjective:  Patient ID: Casey Pearson, female    DOB: 1981/07/08  Age: 41 y.o. MRN: [id number]  CC: Gastroesophageal Reflux   HPI Casey Pearson presents for to establish -  She has been treated for GERD and constipation. She has felt well recently and offers no complaints today.  History Casey Pearson has a past medical history of GERD (gastroesophageal reflux disease) and Medical history non-contributory.   She has a past surgical history that includes Appendectomy; Cesarean section; Diagnostic laparoscopy with removal of ectopic pregnancy (N/A, 02/11/2019); and Upper gastrointestinal endoscopy.   Her family history includes Diabetes in her mother; Hypertension in her father; Stroke in her father.She reports that she has never smoked. She has never used smokeless tobacco. She reports that she does not drink alcohol and does not use drugs.  Outpatient Medications Prior to Visit  Medication Sig Dispense Refill   cyanocobalamin 1000 MCG tablet Take 1 tablet by mouth daily as needed.     diclofenac Sodium (VOLTAREN) 1 % GEL Apply 4 g topically 4 (four) times daily. Apply to affected areas 4 times daily as needed for pain. 100 g 2   dicyclomine (BENTYL) 10 MG capsule Take 1 capsule (10 mg total) by mouth QID. 120 capsule 2   famotidine (PEPCID) 20 MG tablet Take 1 tablet (20 mg total) by mouth at bedtime. 30 tablet 5   omeprazole (PRILOSEC) 20 MG capsule Take 1 capsule (20 mg total) by mouth daily. 90 capsule 3   polyethylene glycol powder (GLYCOLAX/MIRALAX) 17 GM/SCOOP powder Take 17 g by mouth as needed.     sucralfate (CARAFATE) 1 GM/10ML suspension Take 10 mLs (1 g total) by mouth 4 (four) times daily for 14 days. (Patient taking differently: Take 1 g by mouth daily as needed.) 560 mL 6   No facility-administered medications prior to visit.    ROS Review of Systems  Constitutional: Negative.  Negative for diaphoresis and fatigue.  HENT: Negative.    Eyes: Negative.    Respiratory:  Negative for cough, chest tightness, shortness of breath and wheezing.   Cardiovascular:  Negative for chest pain, palpitations and leg swelling.  Gastrointestinal:  Negative for abdominal pain, constipation and diarrhea.  Endocrine: Negative.   Genitourinary:  Negative for difficulty urinating.  Musculoskeletal: Negative.   Skin: Negative.   Neurological:  Negative for dizziness, weakness and light-headedness.  Hematological:  Negative for adenopathy. Does not bruise/bleed easily.  Psychiatric/Behavioral: Negative.      Objective:  BP 126/70 (BP Location: Right Arm, Patient Position: Sitting, Cuff Size: Normal)   Pulse 69   Temp 98 F (36.7 C) (Oral)   Ht 5\' 2"  (1.575 m)   Wt 107 lb (48.5 kg)   SpO2 99%   BMI 19.57 kg/m   Physical Exam Vitals reviewed.  HENT:     Mouth/Throat:     Mouth: Mucous membranes are moist.  Eyes:     General: No scleral icterus.    Conjunctiva/sclera: Conjunctivae normal.  Cardiovascular:     Rate and Rhythm: Normal rate and regular rhythm.     Heart sounds: No murmur heard. Pulmonary:     Effort: Pulmonary effort is normal.     Breath sounds: No stridor. No wheezing, rhonchi or rales.  Abdominal:     General: Abdomen is flat.     Tenderness: There is no abdominal tenderness. There is no guarding or rebound.     Hernia: No hernia is present.  Musculoskeletal:        General:  Normal range of motion.     Cervical back: Neck supple.     Right lower leg: No edema.     Left lower leg: No edema.  Lymphadenopathy:     Cervical: No cervical adenopathy.  Skin:    General: Skin is warm and dry.  Neurological:     General: No focal deficit present.     Mental Status: She is alert.     Lab Results  Component Value Date   WBC 7.1 09/09/2021   HGB 12.5 09/09/2021   HCT 38 09/09/2021   PLT 260 09/09/2021   GLUCOSE 113 (H) 08/16/2021   CHOL 178 06/27/2020   TRIG 148 06/27/2020   HDL 44 06/27/2020   LDLCALC 118 06/27/2020    ALT 8 09/09/2021   AST 13 09/09/2021   NA 138 09/09/2021   K 3.8 09/09/2021   CL 103 09/09/2021   CREATININE 0.6 09/09/2021   BUN 7 09/09/2021   CO2 31 (A) 09/09/2021   TSH 2.30 06/27/2020     Assessment & Plan:   Sharrie was seen today for gastroesophageal reflux.  Diagnoses and all orders for this visit:  Cervical cancer screening -     Ambulatory referral to Gynecology  Gastroesophageal reflux disease without esophagitis- Her sx's are well controlled.   I am having Casey Pearson maintain her polyethylene glycol powder, cyanocobalamin, sucralfate, famotidine, diclofenac Sodium, dicyclomine, and omeprazole.  No orders of the defined types were placed in this encounter.    Follow-up: Return in about 6 months (around 11/17/2022).  Casey Calico, MD

## 2022-10-26 ENCOUNTER — Telehealth: Payer: Self-pay | Admitting: Gastroenterology

## 2022-10-26 NOTE — Telephone Encounter (Signed)
Inbound call from patient stating that she has been having a really bad flare up of her acid reflux and is requesting to speak to nurse. Please advise.

## 2022-10-26 NOTE — Telephone Encounter (Signed)
Pt stated that she recently stated having chest pressure along with heart burn about 6 days ago, Taking the Pepcid along with Omeprazole but not bring any relief. Pt was scheduled for an office visit with Dr. Lyndel Safe 10/28/2022 at 11:20 AM.   Pt was notified in regard to the chest pressure if she start to develop and chest pain, SOB, Palpitations of the chest then she will need to proceed to the Urgent care or ED for evaluation. Pt notified that the GERD symptoms can be very similar to cardiac symptoms. Pt stated that she had these same symptoms last time that Dr. Lyndel Safe treated her for GERD.  Pt verbalized understanding with all questions answered.

## 2022-10-28 ENCOUNTER — Encounter: Payer: Self-pay | Admitting: Gastroenterology

## 2022-10-28 ENCOUNTER — Ambulatory Visit: Payer: 59 | Admitting: Gastroenterology

## 2022-10-28 ENCOUNTER — Other Ambulatory Visit: Payer: Self-pay

## 2022-10-28 VITALS — BP 100/70 | HR 72 | Ht 62.0 in | Wt 109.1 lb

## 2022-10-28 DIAGNOSIS — K219 Gastro-esophageal reflux disease without esophagitis: Secondary | ICD-10-CM

## 2022-10-28 DIAGNOSIS — K581 Irritable bowel syndrome with constipation: Secondary | ICD-10-CM | POA: Diagnosis not present

## 2022-10-28 MED ORDER — FAMOTIDINE 20 MG PO TABS: 20.0000 mg | ORAL_TABLET | Freq: Every day | ORAL | 5 refills | Status: DC

## 2022-10-28 MED ORDER — OMEPRAZOLE 20 MG PO CPDR: 20.0000 mg | DELAYED_RELEASE_CAPSULE | Freq: Every day | ORAL | 3 refills | Status: DC

## 2022-10-28 NOTE — Progress Notes (Signed)
Chief Complaint: FU  Referring Provider:  Janith Lima, MD      ASSESSMENT AND PLAN;   #1. GERD   #2. 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  + H. pylori antibody-treated   Plan: -Resume omeprazole '20mg'$  QD -pepcid '20mg'$  po QHS x 4 weeks, then can stop. -Continue Carafate elixir prn. -Continue miralax prn -If with any lower GI problems, would consider colonoscopy.  Otherwise routine screening colon at age 31. -FU in 1 year.  Earlier, if with any problems    HPI:    Casey Pearson is a 42 y.o. female  Accompanied by her husband  For FU  Started having increasing reflux ever since she returned from [place] and stopped omeprazole.  She is back on it now and feels much better.  She was also taking ayurvedic meds-which were helping until she ran out.  Took Carafate once-felt better.  No weight loss.  No nausea, vomiting, heartburn, regurgitation, odynophagia or dysphagia.  No significant diarrhea or constipation.  No melena or hematochezia. No unintentional weight loss. No abdominal pain.  She has also been using MiraLAX as needed     From previous notes: 2 distinct episodes of LUQ pain crampy pain First episode 6 months ago lasting 10 days and then most recently last month when she went to urgent care center and was given a trial of Bentyl which did help a lot.  She has longstanding history of constipation " all her life" with pellet-like stools.   She has been under considerable stress lately since her dad passed away due to CVA at age 24 in [place]  Nl CBC (Hb 12.7), CMP, lipase, TSH 06/2020  Low Vit B12, given p.o. supplements.  Subsequent B12 was normal.  No definite history of lactose or gluten intolerance.  She does drink plenty of water.  And lately has started walking as well.  Wt Readings from Last 3 Encounters:  10/28/22 109 lb 2 oz (49.5 kg)  05/18/22 107 lb (48.5 kg)   12/29/21 106 lb 8 oz (48.3 kg)     Past GI work-up:  NCCT (renal stone protocol) 09/12/2020 for hematuria -No acute intra-abdominal or intrapelvic pathology on a noncontrast  exam.  -No evidence of renal calculi or hydronephrosis.   EGD 10/2021: Normal EGD Negative biopsies for HP, negative small bowel biopsies for celiac.   Past Medical History:  Diagnosis Date   GERD (gastroesophageal reflux disease)    Medical history non-contributory     Past Surgical History:  Procedure Laterality Date   APPENDECTOMY     CESAREAN SECTION     DIAGNOSTIC LAPAROSCOPY WITH REMOVAL OF ECTOPIC PREGNANCY N/A 02/11/2019   Procedure: Laparoscopic LEFT Salpingo Oophorectomy AND REMOVAL OF ECTOPIC PREGNANCY;  Surgeon: Charyl Bigger, MD;  Location: [facility];  Service: Gynecology;  Laterality: N/A;   UPPER GASTROINTESTINAL ENDOSCOPY      Family History  Problem Relation Age of Onset   Diabetes Mother    Stroke Father    Hypertension Father    Pancreatic cancer Neg Hx    Colon cancer Neg Hx    Esophageal cancer Neg Hx    Liver disease Neg Hx    Stomach cancer Neg Hx    Rectal cancer Neg Hx     Social History   Tobacco Use   Smoking status: Never   Smokeless tobacco: Never  Vaping Use   Vaping Use: Never used  Substance Use Topics   Alcohol use: Never   Drug use: Never    Current Outpatient Medications  Medication Sig Dispense Refill   cyanocobalamin 1000 MCG tablet Take 1 tablet by mouth daily as needed.     diclofenac Sodium (VOLTAREN) 1 % GEL Apply 4 g topically 4 (four) times daily. Apply to affected areas 4 times daily as needed for pain. 100 g 2   dicyclomine (BENTYL) 10 MG capsule Take 1 capsule (10 mg total) by mouth QID. 120 capsule 2   famotidine (PEPCID) 20 MG tablet Take 1 tablet (20 mg total) by mouth at bedtime. 30 tablet 5   omeprazole (PRILOSEC) 20 MG capsule Take 1 capsule (20 mg total) by mouth daily. 90 capsule 3   polyethylene glycol powder (GLYCOLAX/MIRALAX)  17 GM/SCOOP powder Take 17 g by mouth as needed.     sucralfate (CARAFATE) 1 GM/10ML suspension Take 10 mLs (1 g total) by mouth 4 (four) times daily for 14 days. (Patient taking differently: Take 1 g by mouth as needed.) 560 mL 6   No current facility-administered medications for this visit.    Allergies  Allergen Reactions   Tramadol     Patient states this medication made her dizzy.    Review of Systems:  Situational anxiety/panic attacks     Physical Exam:    Ht '5\' 2"'$  (1.575 m)   Wt 109 lb 2 oz (49.5 kg)   BMI 19.96 kg/m  Wt Readings from Last 3 Encounters:  10/28/22 109 lb 2 oz (49.5 kg)  05/18/22 107 lb (48.5 kg)  12/29/21 106 lb 8 oz (48.3 kg)   Constitutional:  Well-developed, in no acute distress. Psychiatric: Normal mood and affect. Behavior is normal. HEENT: Pupils normal.  Conjunctivae are normal. No scleral icterus. Abdominal: Soft, nondistended. Nontender. Bowel sounds active throughout. There are no masses palpable. No hepatomegaly. Neurological: Alert and oriented to person place and time. Skin: Skin is warm and dry. No rashes noted.  Data Reviewed: I have personally reviewed following labs and imaging studies  CBC:    Latest Ref Rng & Units 09/09/2021   12:00 AM 08/16/2021    9:51 PM 03/04/2021    3:20 PM  CBC  WBC  7.1     9.4  6.9   Hemoglobin 12.0 - 16.0 12.5     12.0  12.2   Hematocrit 36 - 46 38     35.5  37.1   Platelets 150 - 400 K/uL 260     235  239      This result is from an external source.    CMP:    Latest Ref Rng & Units 09/09/2021   12:00 AM 08/16/2021    9:51 PM 03/04/2021    3:20 PM  CMP  Glucose 70 - 99 mg/dL  113  93   BUN 4 - '21 7     13  5   '$ Creatinine 0.5 - 1.1 0.6     0.51  0.57   Sodium 137 - 147 138     132  140   Potassium 3.5 - 5.1 mEq/L 3.8     3.9  3.7   Chloride 99 - 108 103     100  101   CO2 13 - '22 31     23  24   '$ Calcium 8.7 - 10.7 9.4     8.7  9.0   Total Protein 6.5 - 8.1 g/dL  7.1  6.4  Total  Bilirubin 0.3 - 1.2 mg/dL  0.5  0.2   Alkaline Phos 25 - 125 52     43  54   AST 13 - 35 '13     18  12   '$ ALT 7 - 35 U/L '8     11  6      '$ This result is from an external source.       Carmell Austria, MD 10/28/2022, 11:22 AM  Cc: Janith Lima, MD

## 2022-10-28 NOTE — Patient Instructions (Addendum)
_______________________________________________________  If your blood pressure at your visit was 140/90 or greater, please contact your primary care physician to follow up on this.  _______________________________________________________  If you are age 41 or older, your body mass index should be between 23-30. Your Body mass index is 19.96 kg/m. If this is out of the aforementioned range listed, please consider follow up with your Primary Care Provider.  If you are age 46 or younger, your body mass index should be between 19-25. Your Body mass index is 19.96 kg/m. If this is out of the aformentioned range listed, please consider follow up with your Primary Care Provider.   ________________________________________________________  The [facility] providers would like to encourage you to use [facility] to communicate with providers for non-urgent requests or questions.  Due to long hold times on the telephone, sending your provider a message by [facility] may be a faster and more efficient way to get a response.  Please allow 48 business hours for a response.  Please remember that this is for non-urgent requests.  _______________________________________________________  Continue current medications  Please follow up in 12 months. Give [place] a call at [phone] to schedule an appointment.  Colonoscopy at age 34. Please call to set up this appointment. We will send a letter as it gets closer.  Thank you,  Dr. Jackquline Denmark

## 2022-10-28 NOTE — Progress Notes (Signed)
Refill request for patient

## 2022-11-24 ENCOUNTER — Encounter: Payer: Self-pay | Admitting: Gastroenterology

## 2022-11-27 ENCOUNTER — Other Ambulatory Visit: Payer: Self-pay

## 2022-11-27 MED ORDER — SUCRALFATE 1 GM/10ML PO SUSP: 1.0000 g | Freq: Four times a day (QID) | ORAL | 6 refills | Status: DC

## 2022-11-30 ENCOUNTER — Telehealth: Payer: Self-pay | Admitting: Gastroenterology

## 2022-11-30 NOTE — Telephone Encounter (Signed)
PT has been having severe gerd. Husband has reached out to have a nurse return call. Advised that he has also sent out MyChart messages as well.

## 2022-11-30 NOTE — Telephone Encounter (Signed)
The pt has been advised that her message has been sent to Dr Chales Abrahams.

## 2022-12-01 NOTE — Telephone Encounter (Signed)
Noted. -Stop omeprazole.  Start Protonix 40 daily, half an hour before meals #30, 6RF -Pepcid 20 mg p.o. nightly -Stop taking all supplements/any other meds. -[place] Abdo- R/O biliary pathology -Check CBC, CMP, lipase, CRP, TSH -Let us know how she is in 2 weeks RG

## 2022-12-02 ENCOUNTER — Other Ambulatory Visit: Payer: Self-pay

## 2022-12-02 DIAGNOSIS — K219 Gastro-esophageal reflux disease without esophagitis: Secondary | ICD-10-CM

## 2022-12-02 DIAGNOSIS — R1012 Left upper quadrant pain: Secondary | ICD-10-CM

## 2022-12-02 MED ORDER — PANTOPRAZOLE SODIUM 40 MG PO TBEC: 40.0000 mg | DELAYED_RELEASE_TABLET | Freq: Every day | ORAL | 6 refills | Status: DC

## 2022-12-02 MED ORDER — FAMOTIDINE 20 MG PO TABS
20.0000 mg | ORAL_TABLET | Freq: Every day | ORAL | 3 refills | Status: AC
Start: 2022-12-02 — End: ?

## 2022-12-02 NOTE — Telephone Encounter (Signed)
Message sent via My Chart to Dr Chales Abrahams

## 2022-12-02 NOTE — Telephone Encounter (Signed)
Patient's husband is requesting to speak to nurse regarding patients medication. Please advise.   Thank you.

## 2022-12-03 NOTE — Telephone Encounter (Signed)
Sure thing.  Lets do that RG

## 2022-12-10 ENCOUNTER — Telehealth: Payer: Self-pay | Admitting: Gastroenterology

## 2022-12-10 DIAGNOSIS — R109 Unspecified abdominal pain: Secondary | ICD-10-CM

## 2022-12-10 NOTE — Telephone Encounter (Signed)
PT requesting a call back to discuss some concerns she has along with medication questions. Please advise.

## 2022-12-10 NOTE — Telephone Encounter (Signed)
Patient called in to discuss medications. Advised her that Dr. Chales Abrahams recommended she stop omeprazole & start pantoprazole QD (previous mychart message), however patient does not want to change medication until [place] is complete on 12/14/22 and Dr. Chales Abrahams has reviewed. She would like results to be discussed with her once they are completed. Advised her that Dr. Chales Abrahams will provide further recommendations. Pt verbalized all understanding. No further questions.

## 2022-12-14 ENCOUNTER — Ambulatory Visit (HOSPITAL_COMMUNITY)
Admission: RE | Admit: 2022-12-14 | Discharge: 2022-12-14 | Disposition: A | Discharge status: Home / Self Care | Payer: 59 | Source: Ambulatory Visit | Attending: Gastroenterology | Admitting: Gastroenterology

## 2022-12-14 DIAGNOSIS — R1012 Left upper quadrant pain: Secondary | ICD-10-CM

## 2022-12-14 DIAGNOSIS — K219 Gastro-esophageal reflux disease without esophagitis: Secondary | ICD-10-CM | POA: Insufficient documentation

## 2022-12-16 NOTE — Telephone Encounter (Addendum)
Patient made aware about results  Patient was questioning about her medications. She is wondering about switching to pantoprazole like you reccommended.  She take omeprazole in the morning about 9 am and then she does have the Pepcid around lunch time and dinner time. She notice that the Pepcid does help her at lunch time from having some pain. She does Carafate as well. She said she take it 4 times in a 24 hour period. And she does probiotics daily But she still have pain at least 4-5 times day with her LUQ pain and she is having spasms in the chest and asked if she can have a muscle relaxant as well. Patient also had blood work done with [facility] that is in care everywhere from 11-09-2022 and they said they want you to look at this because she didn't do the lab work you requested on 12-02-2022. I made an appointment for 02-11-2023 for her but please advise. She is requesting a call back from you at  [phone]

## 2022-12-17 ENCOUNTER — Encounter: Payer: Self-pay | Admitting: Gastroenterology

## 2022-12-17 ENCOUNTER — Other Ambulatory Visit (INDEPENDENT_AMBULATORY_CARE_PROVIDER_SITE_OTHER): Payer: 59

## 2022-12-17 DIAGNOSIS — K219 Gastro-esophageal reflux disease without esophagitis: Secondary | ICD-10-CM | POA: Diagnosis not present

## 2022-12-17 DIAGNOSIS — R1012 Left upper quadrant pain: Secondary | ICD-10-CM | POA: Diagnosis not present

## 2022-12-17 LAB — COMPREHENSIVE METABOLIC PANEL
ALT: 7 U/L (ref 0–35)
AST: 11 U/L (ref 0–37)
Albumin: 4 g/dL (ref 3.5–5.2)
Alkaline Phosphatase: 34 U/L — ABNORMAL LOW (ref 39–117)
BUN: 9 mg/dL (ref 6–23)
CO2: 28 mEq/L (ref 19–32)
Calcium: 8.8 mg/dL (ref 8.4–10.5)
Chloride: 105 mEq/L (ref 96–112)
Creatinine, Ser: 0.68 mg/dL (ref 0.40–1.20)
GFR: 108.97 mL/min (ref >60.00)
Glucose, Bld: 80 mg/dL (ref 70–99)
Potassium: 3.8 mEq/L (ref 3.5–5.1)
Sodium: 139 mEq/L (ref 135–145)
Total Bilirubin: 0.6 mg/dL (ref 0.2–1.2)
Total Protein: 6.7 g/dL (ref 6.0–8.3)

## 2022-12-17 LAB — CBC WITH DIFFERENTIAL/PLATELET
Basophils Absolute: 0 10*3/uL (ref 0.0–0.1)
Basophils Relative: 0.5 % (ref 0.0–3.0)
Eosinophils Absolute: 0.2 10*3/uL (ref 0.0–0.7)
Eosinophils Relative: 2.8 % (ref 0.0–5.0)
HCT: 35.8 % — ABNORMAL LOW (ref 36.0–46.0)
Hemoglobin: 12.3 g/dL (ref 12.0–15.0)
Lymphocytes Relative: 21.1 % (ref 12.0–46.0)
Lymphs Abs: 1.6 10*3/uL (ref 0.7–4.0)
MCHC: 34.3 g/dL (ref 30.0–36.0)
MCV: 88.7 fl (ref 78.0–100.0)
Monocytes Absolute: 0.4 10*3/uL (ref 0.1–1.0)
Monocytes Relative: 5.9 % (ref 3.0–12.0)
Neutro Abs: 5.3 10*3/uL (ref 1.4–7.7)
Neutrophils Relative %: 69.7 % (ref 43.0–77.0)
Platelets: 227 10*3/uL (ref 150.0–400.0)
RBC: 4.03 Mil/uL (ref 3.87–5.11)
RDW: 12.7 % (ref 11.5–15.5)
WBC: 7.6 10*3/uL (ref 4.0–10.5)

## 2022-12-17 LAB — LIPASE: Lipase: 23 U/L (ref 11.0–59.0)

## 2022-12-17 LAB — TSH: TSH: 1.34 u[IU]/mL (ref 0.35–5.50)

## 2022-12-17 LAB — C-REACTIVE PROTEIN: CRP: <1 mg/dL (ref 0.5–20.0)

## 2022-12-17 NOTE — Addendum Note (Signed)
Addended by: Lurline Hare on: 12/17/2022 04:33 PM   Modules accepted: Orders

## 2022-12-17 NOTE — Telephone Encounter (Signed)
Discussed in detail with patient  LUQ pain with previous history of constipation, now occasional diarrhea- neg [place], normal CBC, CMP, CRP, TSH  Omeprazole 20 mg p.o. every morning, Pepcid before lunch and dinner is working for reflux but not for pain  Plan: -Proceed with CT Abdo/pelvis with contrast (persistent and continued Abdo pain).  Please include lower chest Her recent labs showed normal BUN/creatinine  RG

## 2022-12-17 NOTE — Telephone Encounter (Signed)
You have been scheduled for a CT scan of the abdomen and pelvis at [facility] (Address [address] [place], [place] [address]).   You are scheduled on 12-27-2022 at 4pm. You should arrive at  130pm for your registration and to drink the contrast. Please follow the written instructions below on the day of your exam:  WARNING: IF YOU ARE ALLERGIC TO IODINE/X-RAY DYE, PLEASE NOTIFY RADIOLOGY IMMEDIATELY AT [phone]! YOU WILL BE GIVEN A 13 HOUR PREMEDICATION PREP.  1) Do not eat or drink anything after 12pm (4 hours prior to your test) 2) You will be given 2 bottles of oral contrast to drink on site. The solution may taste better if refrigerated, but do NOT add ice or any other liquid to this solution. Shake well before drinking.    Drink 1 bottle of contrast @ 2pm (2 hours prior to your exam)  Drink 1 bottle of contrast @ 3pm (1 hour prior to your exam)  You may take any medications as prescribed with a small amount of water, if necessary. If you take any of the following medications: METFORMIN, GLUCOPHAGE, GLUCOVANCE, AVANDAMET, RIOMET, FORTAMET, ACTOPLUS MET, JANUMET, GLUMETZA or METAGLIP, you MAY be asked to HOLD this medication 48 hours AFTER the exam.  The purpose of you drinking the oral contrast is to aid in the visualization of your intestinal tract. The contrast solution may cause some diarrhea. Depending on your individual set of symptoms, you may also receive an intravenous injection of x-ray contrast/dye. Plan on being at [facility] for 30 minutes or longer, depending on the type of exam you are having performed.  This test typically takes 30-45 minutes to complete.  If you have any questions regarding your exam or if you need to reschedule, you may call the CT department at [phone] between the hours of 8:00 am and 5:00 pm, Monday-Friday.

## 2022-12-17 NOTE — Telephone Encounter (Signed)
Patient made aware and was given a number to call to reschedule

## 2022-12-24 ENCOUNTER — Encounter (HOSPITAL_BASED_OUTPATIENT_CLINIC_OR_DEPARTMENT_OTHER): Payer: Self-pay

## 2022-12-24 ENCOUNTER — Ambulatory Visit (HOSPITAL_BASED_OUTPATIENT_CLINIC_OR_DEPARTMENT_OTHER)
Admission: RE | Admit: 2022-12-24 | Discharge: 2022-12-24 | Disposition: A | Discharge status: Home / Self Care | Payer: 59 | Source: Ambulatory Visit | Attending: Gastroenterology | Admitting: Gastroenterology

## 2022-12-24 DIAGNOSIS — R109 Unspecified abdominal pain: Secondary | ICD-10-CM | POA: Diagnosis present

## 2022-12-24 MED ORDER — IOHEXOL 300 MG/ML  SOLN
100.0000 mL | Freq: Once | INTRAMUSCULAR | Status: AC | PRN
  Administered 2022-12-24: 60 mL via INTRAVENOUS

## 2022-12-27 ENCOUNTER — Ambulatory Visit (HOSPITAL_BASED_OUTPATIENT_CLINIC_OR_DEPARTMENT_OTHER): Payer: 59

## 2023-01-29 ENCOUNTER — Telehealth: Payer: Self-pay | Admitting: Gastroenterology

## 2023-01-29 NOTE — Telephone Encounter (Signed)
PT wants to speak to Dr. Chales Abrahams directly pertaining to her acid reflux. She has to go to [place] for a month and was trying to schedule to see him before she left

## 2023-02-04 NOTE — Telephone Encounter (Signed)
Has follow-up appointment.  Will discuss RG

## 2023-02-11 ENCOUNTER — Ambulatory Visit: Payer: 59 | Admitting: Gastroenterology

## 2023-02-11 ENCOUNTER — Encounter: Payer: Self-pay | Admitting: Gastroenterology

## 2023-02-11 VITALS — BP 99/62 | HR 70 | Ht 62.0 in | Wt 110.4 lb

## 2023-02-11 DIAGNOSIS — R768 Other specified abnormal immunological findings in serum: Secondary | ICD-10-CM

## 2023-02-11 DIAGNOSIS — K219 Gastro-esophageal reflux disease without esophagitis: Secondary | ICD-10-CM | POA: Diagnosis not present

## 2023-02-11 DIAGNOSIS — K581 Irritable bowel syndrome with constipation: Secondary | ICD-10-CM

## 2023-02-11 DIAGNOSIS — R1012 Left upper quadrant pain: Secondary | ICD-10-CM | POA: Diagnosis not present

## 2023-02-11 MED ORDER — DICYCLOMINE HCL 10 MG PO CAPS: 10.0000 mg | ORAL_CAPSULE | Freq: Two times a day (BID) | ORAL | 2 refills | Status: AC

## 2023-02-11 MED ORDER — DICYCLOMINE HCL 10 MG PO CAPS: 10.0000 mg | ORAL_CAPSULE | Freq: Three times a day (TID) | ORAL | 2 refills | Status: DC

## 2023-02-11 NOTE — Patient Instructions (Addendum)
_______________________________________________________  If your blood pressure at your visit was 140/90 or greater, please contact your primary care physician to follow up on this.  _______________________________________________________  If you are age 41 or older, your body mass index should be between 23-30. Your Body mass index is 20.19 kg/m. If this is out of the aforementioned range listed, please consider follow up with your Primary Care Provider.  If you are age 69 or younger, your body mass index should be between 19-25. Your Body mass index is 20.19 kg/m. If this is out of the aformentioned range listed, please consider follow up with your Primary Care Provider.   ________________________________________________________  The [facility] providers would like to encourage you to use [facility] to communicate with providers for non-urgent requests or questions.  Due to long hold times on the telephone, sending your provider a message by [facility] may be a faster and more efficient way to get a response.  Please allow 48 business hours for a response.  Please remember that this is for non-urgent requests.  _______________________________________________________   We have sent the following medications to your pharmacy for you to pick up at your convenience: Bentyl   Thank you,  Dr. Lynann Bologna

## 2023-02-11 NOTE — Progress Notes (Signed)
Chief Complaint: FU  Referring Provider:  Etta Grandchild, MD      ASSESSMENT AND PLAN;   #1. GERD   #2. 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  + H. pylori antibody-treated   Plan: -Resume omeprazole 20mg  QD -Continue pepcid 20mg  po BID -Bentyl 10mg  po BID #120 2RF -If with any lower GI problems, would consider colonoscopy.  Otherwise routine screening colon at age 42. -FU in 1 year.  Earlier, if with any problems    HPI:    Casey Pearson is a 42 y.o. female  Accompanied by her husband  For FU  Much better since she is taking omeprazole in AM, Pepcid twice daily (lunch and dinner)  No further chest pains.  No abdominal pain.  Had negative CT Abdo/pelvis and ultrasound 11/2022.  Had normal CBC, CMP, TSH.  Denies having any odynophagia or dysphagia.  She denies having any diarrhea or constipation.  She is going to take a trip to [place] in July 2024.  Wanted to see [place] prior to to the trip.  No weight loss.   Wt Readings from Last 3 Encounters:  02/11/23 110 lb 6 oz (50.1 kg)  10/28/22 109 lb 2 oz (49.5 kg)  05/18/22 107 lb (48.5 kg)     From previous notes: 2 distinct episodes of LUQ pain crampy pain First episode 6 months ago lasting 10 days and then most recently last month when she went to urgent care center and was given a trial of Bentyl which did help a lot.  She has longstanding history of constipation " all her life" with pellet-like stools.   She has been under considerable stress lately since her dad passed away due to CVA at age 92 in [place]  Nl CBC (Hb 12.7), CMP, lipase, TSH 06/2020  Low Vit B12, given p.o. supplements.  Subsequent B12 was normal.  No definite history of lactose or gluten intolerance.  She does drink plenty of water.  And lately has started walking as well.  Wt Readings from Last 3 Encounters:  02/11/23 110 lb 6 oz (50.1 kg)  10/28/22 109 lb 2 oz  (49.5 kg)  05/18/22 107 lb (48.5 kg)     Past GI work-up: CT Abdo/pelvis with contrast 12/26/2022 1. No acute abnormality in the abdomen or pelvis. 2. Lobular uterine contour commonly reflects uterine leiomyomas   Ultrasound 11/2022 1. Gallbladder polyps measuring up to 5 mm. No imaging follow-up needed. 2. No cholelithiasis or sonographic evidence for acute cholecystitis.   NCCT (renal stone protocol) 09/12/2020 for hematuria -No acute intra-abdominal or intrapelvic pathology on a noncontrast  exam.  -No evidence of renal calculi or hydronephrosis.   EGD 10/2021: Normal EGD Negative biopsies for HP, negative small bowel biopsies for celiac.   Past Medical History:  Diagnosis Date   GERD (gastroesophageal reflux disease)    Medical history non-contributory     Past Surgical History:  Procedure Laterality Date   APPENDECTOMY     CESAREAN SECTION     DIAGNOSTIC LAPAROSCOPY WITH REMOVAL OF ECTOPIC PREGNANCY N/A 02/11/2019   Procedure: Laparoscopic LEFT Salpingo Oophorectomy AND REMOVAL OF ECTOPIC PREGNANCY;  Surgeon: Vick Frees, MD;  Location: [facility] OR;  Service: Gynecology;  Laterality: N/A;   UPPER GASTROINTESTINAL ENDOSCOPY      Family History  Problem Relation Age of Onset   Diabetes Mother    Stroke Father    Hypertension Father  Pancreatic cancer Neg Hx    Colon cancer Neg Hx    Esophageal cancer Neg Hx    Liver disease Neg Hx    Stomach cancer Neg Hx    Rectal cancer Neg Hx     Social History   Tobacco Use   Smoking status: Never   Smokeless tobacco: Never  Vaping Use   Vaping Use: Never used  Substance Use Topics   Alcohol use: Never   Drug use: Never    Current Outpatient Medications  Medication Sig Dispense Refill   dicyclomine (BENTYL) 10 MG capsule Take 1 capsule (10 mg total) by mouth QID. 120 capsule 2   famotidine (PEPCID) 20 MG tablet Take 1 tablet (20 mg total) by mouth at bedtime. 30 tablet 3   omeprazole (PRILOSEC) 20 MG capsule  Take 20 mg by mouth daily.     polyethylene glycol powder (GLYCOLAX/MIRALAX) 17 GM/SCOOP powder Take 17 g by mouth as needed.     No current facility-administered medications for this visit.    Allergies  Allergen Reactions   Tramadol     Patient states this medication made her dizzy.    Review of Systems:  Situational anxiety/panic attacks     Physical Exam:    BP 99/62   Pulse 70   Ht 5\' 2"  (1.575 m)   Wt 110 lb 6 oz (50.1 kg)   SpO2 100%   BMI 20.19 kg/m  Wt Readings from Last 3 Encounters:  02/11/23 110 lb 6 oz (50.1 kg)  10/28/22 109 lb 2 oz (49.5 kg)  05/18/22 107 lb (48.5 kg)   Constitutional:  Well-developed, in no acute distress. Psychiatric: Normal mood and affect. Behavior is normal. HEENT: Pupils normal.  Conjunctivae are normal. No scleral icterus. Abdominal: Soft, nondistended. Nontender. Bowel sounds active throughout. There are no masses palpable. No hepatomegaly. Neurological: Alert and oriented to person place and time. Skin: Skin is warm and dry. No rashes noted.  Data Reviewed: I have personally reviewed following labs and imaging studies  CBC:    Latest Ref Rng & Units 12/17/2022    8:28 AM 09/09/2021   12:00 AM 08/16/2021    9:51 PM  CBC  WBC 4.0 - 10.5 K/uL 7.6  7.1     9.4   Hemoglobin 12.0 - 15.0 g/dL 16.1  09.6     04.5   Hematocrit 36.0 - 46.0 % 35.8  38     35.5   Platelets 150.0 - 400.0 K/uL 227.0  260     235      This result is from an external source.    CMP:    Latest Ref Rng & Units 12/17/2022    8:28 AM 09/09/2021   12:00 AM 08/16/2021    9:51 PM  CMP  Glucose 70 - 99 mg/dL 80   409   BUN 6 - 23 mg/dL 9  7     13    Creatinine 0.40 - 1.20 mg/dL 8.11  0.6     9.14   Sodium 135 - 145 mEq/L 139  138     132   Potassium 3.5 - 5.1 mEq/L 3.8  3.8     3.9   Chloride 96 - 112 mEq/L 105  103     100   CO2 19 - 32 mEq/L 28  31     23    Calcium 8.4 - 10.5 mg/dL 8.8  9.4     8.7   Total Protein 6.0 - 8.3  g/dL 6.7   7.1   Total  Bilirubin 0.2 - 1.2 mg/dL 0.6   0.5   Alkaline Phos 39 - 117 U/L 34  52     43   AST 0 - 37 U/L 11  13     18    ALT 0 - 35 U/L 7  8     11       This result is from an external source.       Edman Circle, MD 02/11/2023, 3:09 PM  Cc: Etta Grandchild, MD

## 2023-04-13 IMAGING — DX DG CHEST 1V PORT
1 series · 1 of 1 positions shown · non-contrast
Comparison: None.

CLINICAL DATA: Dizziness

EXAM:
PORTABLE CHEST 1 VIEW

[chest ap]
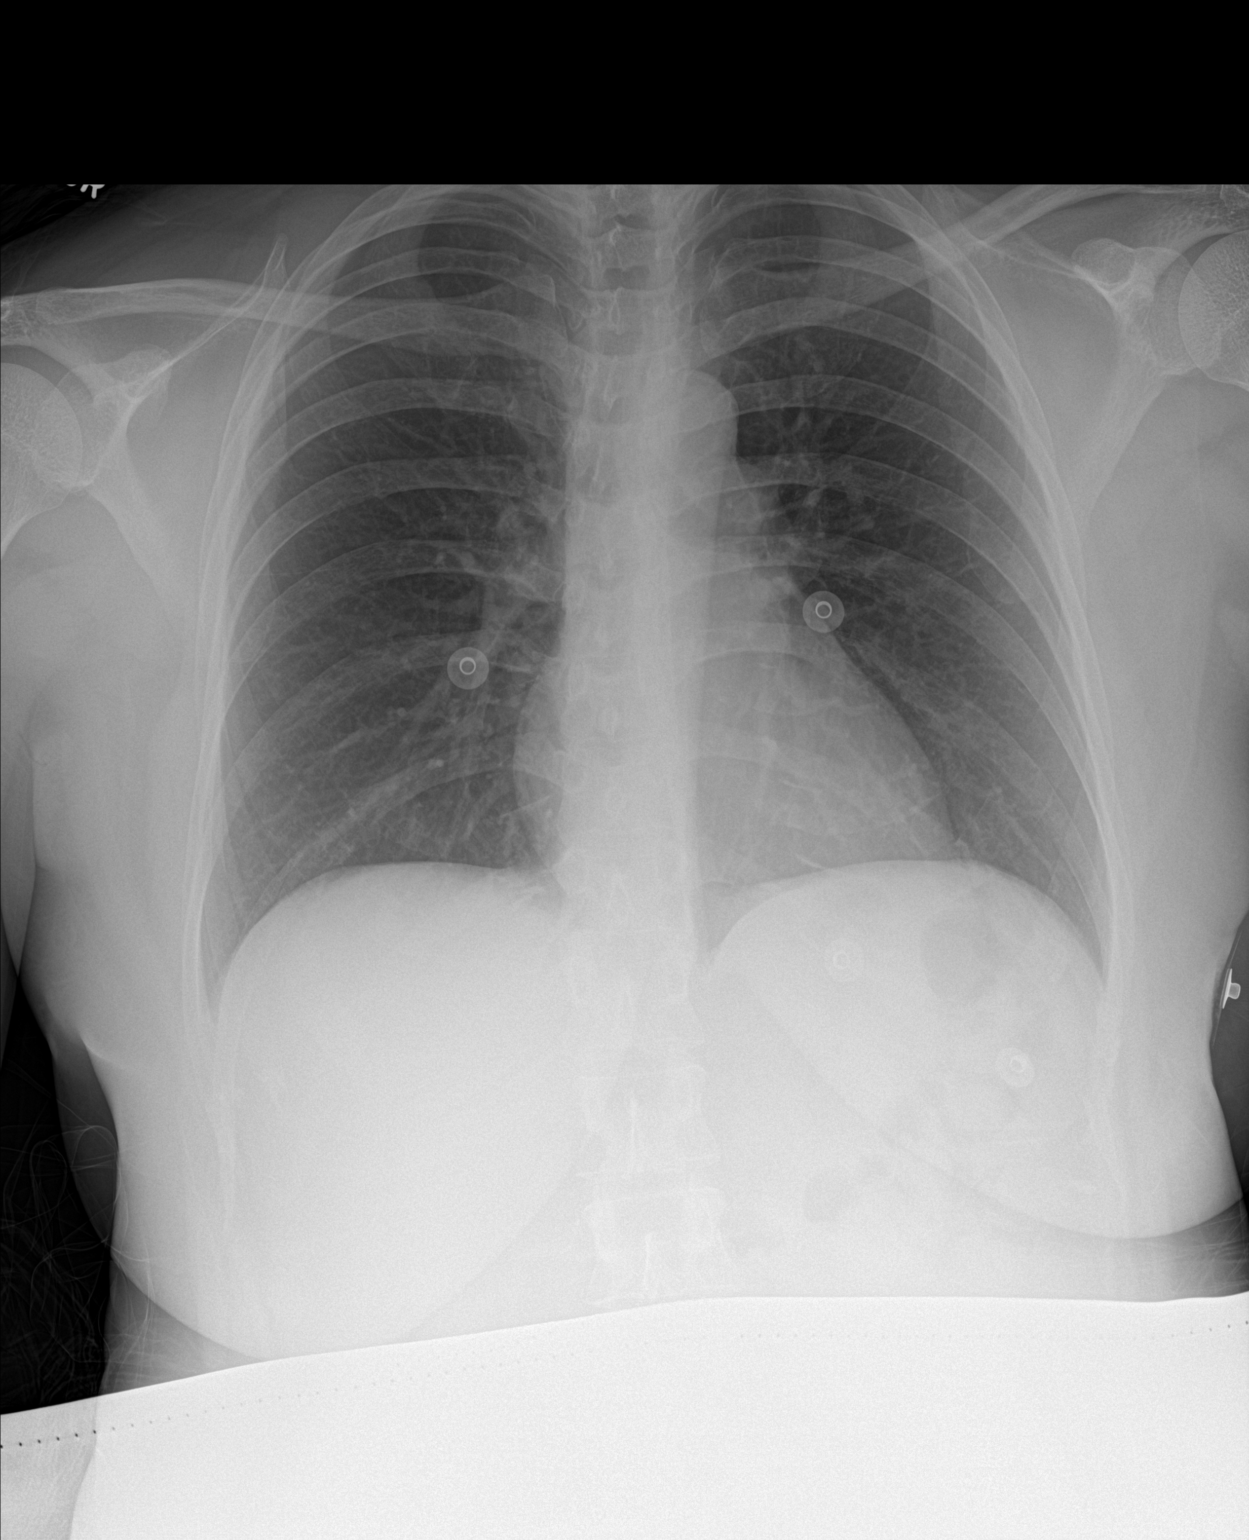

[1 of 1 positions shown; findings below may reference images not displayed]

FINDINGS: The heart size and mediastinal contours are within normal limits.
Both lungs are clear. The visualized skeletal structures are
unremarkable.
IMPRESSION: No active disease.

## 2023-11-26 ENCOUNTER — Ambulatory Visit (INDEPENDENT_AMBULATORY_CARE_PROVIDER_SITE_OTHER)

## 2023-11-26 ENCOUNTER — Ambulatory Visit
Admission: EM | Admit: 2023-11-26 | Discharge: 2023-11-26 | Disposition: A | Discharge status: Home / Self Care | Attending: Family Medicine | Admitting: Family Medicine

## 2023-11-26 DIAGNOSIS — M722 Plantar fascial fibromatosis: Secondary | ICD-10-CM

## 2023-11-26 DIAGNOSIS — R200 Anesthesia of skin: Secondary | ICD-10-CM | POA: Diagnosis not present

## 2023-11-26 DIAGNOSIS — M79672 Pain in left foot: Secondary | ICD-10-CM | POA: Diagnosis not present

## 2023-11-26 DIAGNOSIS — R202 Paresthesia of skin: Secondary | ICD-10-CM | POA: Diagnosis not present

## 2023-11-26 MED ORDER — DICLOFENAC SODIUM 1 % EX GEL: 2.0000 g | Freq: Four times a day (QID) | CUTANEOUS | 0 refills | Status: AC

## 2023-11-26 NOTE — ED Provider Notes (Signed)
 [facility]  Note:  This document was prepared using Conservation officer, historic buildings and may include unintentional dictation errors.  MRN: [id number] DOB: 1981/06/08  Subjective:   Casey Pearson is a 43 y.o. female presenting for 54-month history of persistent intermittent left foot pain along the plantar surface.  In the past 2 days has been more consistent.  Pain is rated as mild, has intermittent numbness and tingling of the same area.  No low back pain.  No radicular symptoms.  No wounds, drainage of pus or bleeding.  No rashes.  No current facility-administered medications for this encounter.  Current Outpatient Medications:    dicyclomine (BENTYL) 10 MG capsule, Take 1 capsule (10 mg total) by mouth QID., Disp: 120 capsule, Rfl: 2   dicyclomine (BENTYL) 10 MG capsule, Take 1 capsule (10 mg total) by mouth 2 (two) times daily., Disp: 120 capsule, Rfl: 2   famotidine (PEPCID) 20 MG tablet, Take 1 tablet (20 mg total) by mouth at bedtime., Disp: 30 tablet, Rfl: 3   omeprazole (PRILOSEC) 20 MG capsule, Take 20 mg by mouth daily., Disp: , Rfl:    polyethylene glycol powder (GLYCOLAX/MIRALAX) 17 GM/SCOOP powder, Take 17 g by mouth as needed., Disp: , Rfl:    Allergies  Allergen Reactions   Tramadol     Patient states this medication made her dizzy.    Past Medical History:  Diagnosis Date   GERD (gastroesophageal reflux disease)    Medical history non-contributory      Past Surgical History:  Procedure Laterality Date   APPENDECTOMY     CESAREAN SECTION     DIAGNOSTIC LAPAROSCOPY WITH REMOVAL OF ECTOPIC PREGNANCY N/A 02/11/2019   Procedure: Laparoscopic LEFT Salpingo Oophorectomy AND REMOVAL OF ECTOPIC PREGNANCY;  Surgeon: Vick Frees, MD;  Location: [facility] OR;  Service: Gynecology;  Laterality: N/A;   UPPER GASTROINTESTINAL ENDOSCOPY      Family History  Problem Relation Age of Onset   Diabetes Mother    Stroke Father    Hypertension  Father    Pancreatic cancer Neg Hx    Colon cancer Neg Hx    Esophageal cancer Neg Hx    Liver disease Neg Hx    Stomach cancer Neg Hx    Rectal cancer Neg Hx     Social History   Tobacco Use   Smoking status: Never   Smokeless tobacco: Never  Vaping Use   Vaping status: Never Used  Substance Use Topics   Alcohol use: Never   Drug use: Never    ROS   Objective:   Vitals: BP (P) 125/73 (BP Location: Right Arm)   Pulse (P) 81   Temp (P) 97.9 F (36.6 C) (Oral)   Resp (P) 16   LMP 11/13/2023   SpO2 (P) 99%   Physical Exam Constitutional:      General: She is not in acute distress.    Appearance: Normal appearance. She is well-developed. She is not ill-appearing, toxic-appearing or diaphoretic.  HENT:     Head: Normocephalic and atraumatic.     Nose: Nose normal.     Mouth/Throat:     Mouth: Mucous membranes are moist.  Eyes:     General: No scleral icterus.       Right eye: No discharge.        Left eye: No discharge.     Extraocular Movements: Extraocular movements intact.  Cardiovascular:     Rate and Rhythm: Normal rate.  Pulmonary:  Effort: Pulmonary effort is normal.  Musculoskeletal:       Feet:  Skin:    General: Skin is warm and dry.  Neurological:     General: No focal deficit present.     Mental Status: She is alert and oriented to person, place, and time.  Psychiatric:        Mood and Affect: Mood normal.        Behavior: Behavior normal.    No results found.   Assessment and Plan :   PDMP not reviewed this encounter.  1. Left foot pain   2. Numbness and tingling of foot   3. Plantar fasciitis of left foot    X-ray over-read was pending at time of discharge, recommended follow up with only abnormal results. Otherwise will not call for negative over-read. Patient was in agreement.  Patient cannot tolerate steroids or NSAIDs orally.  Recommended diclofenac gel.  Referral placed to podiatry for further consultation, management of  suspected plantar fasciitis, versus other peripheral neuropathy.  Counseled patient on potential for adverse effects with medications prescribed/recommended today, ER and return-to-clinic precautions discussed, patient verbalized understanding.    Wallis Bamberg, [place] 11/26/23 1610

## 2023-11-26 NOTE — ED Triage Notes (Signed)
 Pt c/o pain and tingling to left foot "off and on 6 months" worse x 2 days-denies injury-NAD-steady gait
